# Patient Record
Sex: Male | Born: 1990 | Race: White | Hispanic: No | Marital: Single | Smoking: Former smoker
Health system: Southern US, Community
[De-identification: ages and names within clinical notes are randomized; demographics above are authoritative.]

## PROBLEM LIST (undated history)

## (undated) DIAGNOSIS — F1110 Opioid abuse, uncomplicated: Secondary | ICD-10-CM

## (undated) DIAGNOSIS — Z789 Other specified health status: Secondary | ICD-10-CM

---

## 2013-12-16 ENCOUNTER — Emergency Department (HOSPITAL_COMMUNITY): Payer: No Typology Code available for payment source

## 2013-12-16 ENCOUNTER — Encounter (HOSPITAL_COMMUNITY): Payer: Self-pay | Admitting: Emergency Medicine

## 2013-12-16 ENCOUNTER — Encounter (HOSPITAL_COMMUNITY): Admission: EM | Disposition: A | Discharge status: Home / Self Care | Payer: Self-pay | Source: Home / Self Care

## 2013-12-16 ENCOUNTER — Inpatient Hospital Stay (HOSPITAL_COMMUNITY)
Admission: EM | Admit: 2013-12-16 | Discharge: 2013-12-24 | DRG: 957 | Disposition: A | Discharge status: Home / Self Care | Payer: No Typology Code available for payment source | Attending: General Surgery | Admitting: General Surgery

## 2013-12-16 DIAGNOSIS — K661 Hemoperitoneum: Secondary | ICD-10-CM | POA: Diagnosis present

## 2013-12-16 DIAGNOSIS — D62 Acute posthemorrhagic anemia: Secondary | ICD-10-CM | POA: Diagnosis not present

## 2013-12-16 DIAGNOSIS — R578 Other shock: Secondary | ICD-10-CM

## 2013-12-16 DIAGNOSIS — Z23 Encounter for immunization: Secondary | ICD-10-CM

## 2013-12-16 DIAGNOSIS — Z9081 Acquired absence of spleen: Secondary | ICD-10-CM

## 2013-12-16 DIAGNOSIS — T794XXA Traumatic shock, initial encounter: Secondary | ICD-10-CM | POA: Diagnosis present

## 2013-12-16 DIAGNOSIS — S36039A Unspecified laceration of spleen, initial encounter: Secondary | ICD-10-CM

## 2013-12-16 DIAGNOSIS — S3681XA Injury of peritoneum, initial encounter: Secondary | ICD-10-CM | POA: Diagnosis present

## 2013-12-16 DIAGNOSIS — F17200 Nicotine dependence, unspecified, uncomplicated: Secondary | ICD-10-CM | POA: Diagnosis present

## 2013-12-16 DIAGNOSIS — J9600 Acute respiratory failure, unspecified whether with hypoxia or hypercapnia: Secondary | ICD-10-CM

## 2013-12-16 DIAGNOSIS — D65 Disseminated intravascular coagulation [defibrination syndrome]: Secondary | ICD-10-CM | POA: Diagnosis present

## 2013-12-16 DIAGNOSIS — F1110 Opioid abuse, uncomplicated: Secondary | ICD-10-CM | POA: Diagnosis present

## 2013-12-16 DIAGNOSIS — E876 Hypokalemia: Secondary | ICD-10-CM | POA: Diagnosis not present

## 2013-12-16 DIAGNOSIS — S3609XA Other injury of spleen, initial encounter: Secondary | ICD-10-CM

## 2013-12-16 DIAGNOSIS — Q8901 Asplenia (congenital): Secondary | ICD-10-CM

## 2013-12-16 HISTORY — PX: SPLENECTOMY, TOTAL: SHX788

## 2013-12-16 HISTORY — DX: Opioid abuse, uncomplicated: F11.10

## 2013-12-16 LAB — CBC WITH DIFFERENTIAL/PLATELET
BASOS ABS: 0 10*3/uL (ref 0.0–0.1)
BASOS PCT: 0 % (ref 0–1)
EOS PCT: 0 % (ref 0–5)
Eosinophils Absolute: 0 10*3/uL (ref 0.0–0.7)
HCT: 31.7 % — ABNORMAL LOW (ref 39.0–52.0)
Hemoglobin: 10.6 g/dL — ABNORMAL LOW (ref 13.0–17.0)
Lymphocytes Relative: 10 % — ABNORMAL LOW (ref 12–46)
Lymphs Abs: 2.1 10*3/uL (ref 0.7–4.0)
MCH: 29.2 pg (ref 26.0–34.0)
MCHC: 33.4 g/dL (ref 30.0–36.0)
MCV: 87.3 fL (ref 78.0–100.0)
Monocytes Absolute: 1.8 10*3/uL — ABNORMAL HIGH (ref 0.1–1.0)
Monocytes Relative: 9 % (ref 3–12)
NEUTROS PCT: 80 % — AB (ref 43–77)
Neutro Abs: 15.8 10*3/uL — ABNORMAL HIGH (ref 1.7–7.7)
PLATELETS: 229 10*3/uL (ref 150–400)
RBC: 3.63 MIL/uL — ABNORMAL LOW (ref 4.22–5.81)
RDW: 13.6 % (ref 11.5–15.5)
WBC: 19.8 10*3/uL — ABNORMAL HIGH (ref 4.0–10.5)

## 2013-12-16 LAB — COMPREHENSIVE METABOLIC PANEL
ALBUMIN: 2.7 g/dL — AB (ref 3.5–5.2)
ALK PHOS: 64 U/L (ref 39–117)
ALT: 18 U/L (ref 0–53)
AST: 32 U/L (ref 0–37)
BILIRUBIN TOTAL: 0.4 mg/dL (ref 0.3–1.2)
BUN: 10 mg/dL (ref 6–23)
CHLORIDE: 102 meq/L (ref 96–112)
CO2: 26 meq/L (ref 19–32)
CREATININE: 0.87 mg/dL (ref 0.50–1.35)
Calcium: 7.8 mg/dL — ABNORMAL LOW (ref 8.4–10.5)
GFR calc Af Amer: >90 mL/min (ref >90)
Glucose, Bld: 204 mg/dL — ABNORMAL HIGH (ref 70–99)
POTASSIUM: 4.6 meq/L (ref 3.7–5.3)
Sodium: 141 mEq/L (ref 137–147)
Total Protein: 5.2 g/dL — ABNORMAL LOW (ref 6.0–8.3)

## 2013-12-16 LAB — PREPARE RBC (CROSSMATCH)

## 2013-12-16 LAB — ETHANOL

## 2013-12-16 SURGERY — SPLENECTOMY
Anesthesia: General | Site: Abdomen

## 2013-12-16 MED ORDER — PHENYLEPHRINE 40 MCG/ML (10ML) SYRINGE FOR IV PUSH (FOR BLOOD PRESSURE SUPPORT)
PREFILLED_SYRINGE | INTRAVENOUS | Status: AC
  Filled 2013-12-16: qty 10

## 2013-12-16 MED ORDER — MORPHINE SULFATE 4 MG/ML IJ SOLN
4.0000 mg | Freq: Once | INTRAMUSCULAR | Status: AC
  Administered 2013-12-16: 4 mg via INTRAVENOUS
  Filled 2013-12-16: qty 1

## 2013-12-16 MED ORDER — SODIUM CHLORIDE 0.9 % IV BOLUS (SEPSIS)
1000.0000 mL | Freq: Once | INTRAVENOUS | Status: AC
  Administered 2013-12-16: 1000 mL via INTRAVENOUS

## 2013-12-16 MED ORDER — HYDROMORPHONE HCL PF 1 MG/ML IJ SOLN
1.0000 mg | Freq: Once | INTRAMUSCULAR | Status: AC
  Administered 2013-12-16: 1 mg via INTRAVENOUS
  Filled 2013-12-16: qty 1

## 2013-12-16 MED ORDER — LIDOCAINE HCL (CARDIAC) 20 MG/ML IV SOLN
INTRAVENOUS | Status: AC
  Filled 2013-12-16: qty 5

## 2013-12-16 MED ORDER — FENTANYL CITRATE 0.05 MG/ML IJ SOLN
75.0000 ug | Freq: Once | INTRAMUSCULAR | Status: AC
  Administered 2013-12-16: 75 ug via INTRAVENOUS
  Filled 2013-12-16: qty 2

## 2013-12-16 MED ORDER — FENTANYL CITRATE 0.05 MG/ML IJ SOLN
INTRAMUSCULAR | Status: AC
  Filled 2013-12-16: qty 5

## 2013-12-16 MED ORDER — ARTIFICIAL TEARS OP OINT
TOPICAL_OINTMENT | OPHTHALMIC | Status: AC
  Filled 2013-12-16: qty 3.5

## 2013-12-16 MED ORDER — ONDANSETRON HCL 4 MG/2ML IJ SOLN
INTRAMUSCULAR | Status: AC
  Filled 2013-12-16: qty 2

## 2013-12-16 MED ORDER — CEFAZOLIN SODIUM-DEXTROSE 2-3 GM-% IV SOLR
2.0000 g | Freq: Three times a day (TID) | INTRAVENOUS | Status: DC
  Administered 2013-12-17 – 2013-12-22 (×16): 2 g via INTRAVENOUS
  Filled 2013-12-16 (×19): qty 50

## 2013-12-16 MED ORDER — IOHEXOL 300 MG/ML  SOLN
100.0000 mL | Freq: Once | INTRAMUSCULAR | Status: AC | PRN
  Administered 2013-12-16: 100 mL via INTRAVENOUS

## 2013-12-16 MED ORDER — MIDAZOLAM HCL 2 MG/2ML IJ SOLN
INTRAMUSCULAR | Status: AC
  Filled 2013-12-16: qty 2

## 2013-12-16 MED ORDER — SUCCINYLCHOLINE CHLORIDE 20 MG/ML IJ SOLN
INTRAMUSCULAR | Status: AC
  Filled 2013-12-16: qty 1

## 2013-12-16 MED ORDER — PROPOFOL 10 MG/ML IV BOLUS
INTRAVENOUS | Status: AC
  Filled 2013-12-16: qty 20

## 2013-12-16 MED ORDER — HYDROMORPHONE HCL PF 1 MG/ML IJ SOLN
2.0000 mg | Freq: Once | INTRAMUSCULAR | Status: AC
  Administered 2013-12-16: 2 mg via INTRAVENOUS
  Filled 2013-12-16: qty 2

## 2013-12-16 SURGICAL SUPPLY — 46 items
BLADE SURG ROTATE 9660 (MISCELLANEOUS) ×3 IMPLANT
CANISTER SUCTION 2500CC (MISCELLANEOUS) ×3 IMPLANT
CLIP TI LARGE 6 (CLIP) IMPLANT
COVER SURGICAL LIGHT HANDLE (MISCELLANEOUS) ×3 IMPLANT
DRAPE LAPAROSCOPIC ABDOMINAL (DRAPES) ×3 IMPLANT
DRAPE UTILITY 15X26 W/TAPE STR (DRAPE) ×6 IMPLANT
DRSG COVADERM 4X14 (GAUZE/BANDAGES/DRESSINGS) ×3 IMPLANT
ELECT BLADE 6.5 EXT (BLADE) ×3 IMPLANT
ELECT REM PT RETURN 9FT ADLT (ELECTROSURGICAL) ×3
ELECTRODE REM PT RTRN 9FT ADLT (ELECTROSURGICAL) ×1 IMPLANT
GLOVE BIO SURGEON STRL SZ 6 (GLOVE) ×3 IMPLANT
GLOVE BIOGEL PI IND STRL 6.5 (GLOVE) ×1 IMPLANT
GLOVE BIOGEL PI IND STRL 7.5 (GLOVE) ×1 IMPLANT
GLOVE BIOGEL PI INDICATOR 6.5 (GLOVE) ×2
GLOVE BIOGEL PI INDICATOR 7.5 (GLOVE) ×2
GLOVE SURG SS PI 7.5 STRL IVOR (GLOVE) ×3 IMPLANT
GOWN STRL REUS W/ TWL LRG LVL3 (GOWN DISPOSABLE) ×2 IMPLANT
GOWN STRL REUS W/TWL 2XL LVL3 (GOWN DISPOSABLE) ×3 IMPLANT
GOWN STRL REUS W/TWL LRG LVL3 (GOWN DISPOSABLE) ×4
HEMOSTAT SURGICEL 2X14 (HEMOSTASIS) IMPLANT
KIT BASIN OR (CUSTOM PROCEDURE TRAY) ×3 IMPLANT
KIT ROOM TURNOVER OR (KITS) ×3 IMPLANT
NS IRRIG 1000ML POUR BTL (IV SOLUTION) ×6 IMPLANT
PACK GENERAL/GYN (CUSTOM PROCEDURE TRAY) ×3 IMPLANT
PAD ARMBOARD 7.5X6 YLW CONV (MISCELLANEOUS) ×6 IMPLANT
RELOAD 45 VASCULAR/THIN (ENDOMECHANICALS) IMPLANT
SEPRAFILM PROCEDURAL PACK 3X5 (MISCELLANEOUS) ×3 IMPLANT
SPECIMEN JAR X LARGE (MISCELLANEOUS) ×3 IMPLANT
SPONGE GAUZE 4X4 12PLY (GAUZE/BANDAGES/DRESSINGS) ×6 IMPLANT
SPONGE INTESTINAL PEANUT (DISPOSABLE) ×3 IMPLANT
SPONGE LAP 18X18 X RAY DECT (DISPOSABLE) ×15 IMPLANT
STAPLE ECHEON FLEX 60 POW ENDO (STAPLE) ×3 IMPLANT
STAPLER VISISTAT 35W (STAPLE) ×3 IMPLANT
SUCTION POOLE TIP (SUCTIONS) ×3 IMPLANT
SUT NOVA 1 T20/GS 25DT (SUTURE) IMPLANT
SUT PDS AB 1 TP1 96 (SUTURE) ×6 IMPLANT
SUT PDS II 0 TP-1 LOOPED 60 (SUTURE) ×3 IMPLANT
SUT SILK 2 0 REEL (SUTURE) IMPLANT
SUT SILK 2 0 SH CR/8 (SUTURE) ×3 IMPLANT
SUT SILK 2 0 TIES 10X30 (SUTURE) ×3 IMPLANT
SUT SILK 3 0 TIES 10X30 (SUTURE) ×3 IMPLANT
TIP RIGID 35CM EVICEL (HEMOSTASIS) IMPLANT
TOWEL OR 17X24 6PK STRL BLUE (TOWEL DISPOSABLE) ×3 IMPLANT
TOWEL OR 17X26 10 PK STRL BLUE (TOWEL DISPOSABLE) ×3 IMPLANT
TRAY FOLEY CATH 14FRSI W/METER (CATHETERS) ×3 IMPLANT
WATER STERILE IRR 1000ML POUR (IV SOLUTION) ×3 IMPLANT
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
## 2013-12-16 NOTE — ED Notes (Signed)
Per Dr. Donell BeersByerly - hold second unit of blood at this time

## 2013-12-16 NOTE — ED Provider Notes (Signed)
D/w radiology He has splenic laceration SBP >100 He is awake/alert D/w dr Donell Beersbyerly, will admit and potential operative repair Blood products have been ordered   Joya Gaskinsonald W Wynonia Medero, MD 12/16/13 2211
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
## 2013-12-16 NOTE — ED Notes (Signed)
Pts mother in family room B
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
## 2013-12-16 NOTE — ED Notes (Signed)
Per Dr. Bebe ShaggyWickline, [facility] as bolus if pt hypotensive.

## 2013-12-16 NOTE — ED Provider Notes (Signed)
D/w dr Donell Beersbyerly Updated on vitals/CT findings She will see patient and admit to ICU  Allen Rivera W Jorden Minchey, MD 12/16/13 2310

## 2013-12-16 NOTE — ED Notes (Signed)
Vital signs stable. 
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
## 2013-12-16 NOTE — ED Notes (Signed)
Patient transported to CT 

## 2013-12-17 ENCOUNTER — Inpatient Hospital Stay (HOSPITAL_COMMUNITY): Payer: No Typology Code available for payment source | Admitting: Anesthesiology

## 2013-12-17 ENCOUNTER — Inpatient Hospital Stay (HOSPITAL_COMMUNITY): Payer: No Typology Code available for payment source

## 2013-12-17 ENCOUNTER — Encounter (HOSPITAL_COMMUNITY): Admission: EM | Disposition: A | Discharge status: Home / Self Care | Payer: Self-pay | Source: Home / Self Care

## 2013-12-17 ENCOUNTER — Encounter (HOSPITAL_COMMUNITY): Payer: No Typology Code available for payment source | Admitting: Anesthesiology

## 2013-12-17 DIAGNOSIS — J95821 Acute postprocedural respiratory failure: Secondary | ICD-10-CM

## 2013-12-17 DIAGNOSIS — S36039A Unspecified laceration of spleen, initial encounter: Secondary | ICD-10-CM

## 2013-12-17 DIAGNOSIS — S3681XA Injury of peritoneum, initial encounter: Secondary | ICD-10-CM

## 2013-12-17 DIAGNOSIS — T794XXA Traumatic shock, initial encounter: Secondary | ICD-10-CM

## 2013-12-17 DIAGNOSIS — D689 Coagulation defect, unspecified: Secondary | ICD-10-CM

## 2013-12-17 HISTORY — PX: LAPAROTOMY: SHX154

## 2013-12-17 LAB — CBC
HCT: 32.6 % — ABNORMAL LOW (ref 39.0–52.0)
HCT: 28.3 % — ABNORMAL LOW (ref 39.0–52.0)
HCT: 28 % — ABNORMAL LOW (ref 39.0–52.0)
HEMATOCRIT: 21.6 % — AB (ref 39.0–52.0)
HEMOGLOBIN: 10 g/dL — AB (ref 13.0–17.0)
Hemoglobin: 7.4 g/dL — ABNORMAL LOW (ref 13.0–17.0)
Hemoglobin: 11.5 g/dL — ABNORMAL LOW (ref 13.0–17.0)
Hemoglobin: 10 g/dL — ABNORMAL LOW (ref 13.0–17.0)
MCH: 29.7 pg (ref 26.0–34.0)
MCH: 30.1 pg (ref 26.0–34.0)
MCH: 29.3 pg (ref 26.0–34.0)
MCH: 29.8 pg (ref 26.0–34.0)
MCHC: 34.3 g/dL (ref 30.0–36.0)
MCHC: 35.3 g/dL (ref 30.0–36.0)
MCHC: 35.3 g/dL (ref 30.0–36.0)
MCHC: 35.7 g/dL (ref 30.0–36.0)
MCV: 86.7 fL (ref 78.0–100.0)
MCV: 85.3 fL (ref 78.0–100.0)
MCV: 83 fL (ref 78.0–100.0)
MCV: 83.3 fL (ref 78.0–100.0)
PLATELETS: 123 10*3/uL — AB (ref 150–400)
Platelets: 157 10*3/uL (ref 150–400)
Platelets: 45 10*3/uL — ABNORMAL LOW (ref 150–400)
Platelets: 161 10*3/uL (ref 150–400)
RBC: 2.49 MIL/uL — AB (ref 4.22–5.81)
RBC: 3.82 MIL/uL — ABNORMAL LOW (ref 4.22–5.81)
RBC: 3.41 MIL/uL — AB (ref 4.22–5.81)
RBC: 3.36 MIL/uL — ABNORMAL LOW (ref 4.22–5.81)
RDW: 14.3 % (ref 11.5–15.5)
RDW: 15.1 % (ref 11.5–15.5)
RDW: 15.2 % (ref 11.5–15.5)
RDW: 15.5 % (ref 11.5–15.5)
WBC: 15.5 10*3/uL — AB (ref 4.0–10.5)
WBC: 11.3 10*3/uL — ABNORMAL HIGH (ref 4.0–10.5)
WBC: 10.3 10*3/uL (ref 4.0–10.5)
WBC: 13.1 10*3/uL — AB (ref 4.0–10.5)

## 2013-12-17 LAB — POCT I-STAT 3, ART BLOOD GAS (G3+)
Acid-base deficit: 3 mmol/L — ABNORMAL HIGH (ref 0.0–2.0)
BICARBONATE: 23.2 meq/L (ref 20.0–24.0)
O2 Saturation: 100 %
PH ART: 7.338 — AB (ref 7.350–7.450)
Patient temperature: 97.6
TCO2: 25 mmol/L (ref 0–100)
pCO2 arterial: 43 mmHg (ref 35.0–45.0)
pO2, Arterial: 374 mmHg — ABNORMAL HIGH (ref 80.0–100.0)

## 2013-12-17 LAB — RAPID URINE DRUG SCREEN, HOSP PERFORMED
AMPHETAMINES: NOT DETECTED
BARBITURATES: NOT DETECTED
Benzodiazepines: POSITIVE — AB
Cocaine: NOT DETECTED
Opiates: POSITIVE — AB
Tetrahydrocannabinol: NOT DETECTED

## 2013-12-17 LAB — GLUCOSE, CAPILLARY
GLUCOSE-CAPILLARY: 101 mg/dL — AB (ref 70–99)
Glucose-Capillary: 212 mg/dL — ABNORMAL HIGH (ref 70–99)
Glucose-Capillary: 253 mg/dL — ABNORMAL HIGH (ref 70–99)
Glucose-Capillary: 152 mg/dL — ABNORMAL HIGH (ref 70–99)
Glucose-Capillary: 86 mg/dL (ref 70–99)

## 2013-12-17 LAB — BLOOD PRODUCT ORDER (VERBAL) VERIFICATION

## 2013-12-17 LAB — BLOOD GAS, ARTERIAL
Acid-base deficit: 11.5 mmol/L — ABNORMAL HIGH (ref 0.0–2.0)
Bicarbonate: 13.9 mEq/L — ABNORMAL LOW (ref 20.0–24.0)
Drawn by: 34779
O2 Content: 2 L/min
O2 SAT: 99.3 %
PATIENT TEMPERATURE: 98.6
PH ART: 7.28 — AB (ref 7.350–7.450)
TCO2: 14.8 mmol/L (ref 0–100)
pCO2 arterial: 30.6 mmHg — ABNORMAL LOW (ref 35.0–45.0)
pO2, Arterial: 127 mmHg — ABNORMAL HIGH (ref 80.0–100.0)

## 2013-12-17 LAB — URINALYSIS, ROUTINE W REFLEX MICROSCOPIC
BILIRUBIN URINE: NEGATIVE
GLUCOSE, UA: 100 mg/dL — AB
HGB URINE DIPSTICK: NEGATIVE
KETONES UR: 15 mg/dL — AB
Leukocytes, UA: NEGATIVE
Nitrite: NEGATIVE
PROTEIN: NEGATIVE mg/dL
Specific Gravity, Urine: 1.027 (ref 1.005–1.030)
Urobilinogen, UA: 1 mg/dL (ref 0.0–1.0)
pH: 6 (ref 5.0–8.0)

## 2013-12-17 LAB — BASIC METABOLIC PANEL
BUN: 11 mg/dL (ref 6–23)
BUN: 11 mg/dL (ref 6–23)
CHLORIDE: 108 meq/L (ref 96–112)
CHLORIDE: 108 meq/L (ref 96–112)
CO2: 24 mEq/L (ref 19–32)
CO2: 21 mEq/L (ref 19–32)
CREATININE: 0.92 mg/dL (ref 0.50–1.35)
Calcium: 6.8 mg/dL — ABNORMAL LOW (ref 8.4–10.5)
Calcium: 6.8 mg/dL — ABNORMAL LOW (ref 8.4–10.5)
Creatinine, Ser: 1.04 mg/dL (ref 0.50–1.35)
GFR calc Af Amer: >90 mL/min (ref >90)
GFR calc non Af Amer: >90 mL/min (ref >90)
GFR calc non Af Amer: >90 mL/min (ref >90)
GLUCOSE: 223 mg/dL — AB (ref 70–99)
Glucose, Bld: 297 mg/dL — ABNORMAL HIGH (ref 70–99)
Potassium: 4.8 mEq/L (ref 3.7–5.3)
Potassium: 5.2 mEq/L (ref 3.7–5.3)
SODIUM: 141 meq/L (ref 137–147)
Sodium: 141 mEq/L (ref 137–147)

## 2013-12-17 LAB — PREPARE RBC (CROSSMATCH)

## 2013-12-17 LAB — DIC (DISSEMINATED INTRAVASCULAR COAGULATION) PANEL
APTT: 52 s — AB (ref 24–37)
D-Dimer, Quant: 0.35 ug/mL-FEU (ref 0.00–0.48)
FIBRINOGEN: 107 mg/dL — AB (ref 204–475)
INR: 2.02 — AB (ref 0.00–1.49)
PLATELETS: 28 10*3/uL — AB (ref 150–400)
Smear Review: NONE SEEN

## 2013-12-17 LAB — PROTIME-INR
INR: 1.58 — ABNORMAL HIGH (ref 0.00–1.49)
Prothrombin Time: 18.4 seconds — ABNORMAL HIGH (ref 11.6–15.2)

## 2013-12-17 LAB — TRIGLYCERIDES: Triglycerides: 75 mg/dL (ref <150)

## 2013-12-17 LAB — DIC (DISSEMINATED INTRAVASCULAR COAGULATION)PANEL: Prothrombin Time: 22.2 seconds — ABNORMAL HIGH (ref 11.6–15.2)

## 2013-12-17 LAB — MRSA PCR SCREENING: MRSA BY PCR: POSITIVE — AB

## 2013-12-17 LAB — ABO/RH: ABO/RH(D): O NEG

## 2013-12-17 SURGERY — LAPAROTOMY, EXPLORATORY
Anesthesia: General | Site: Abdomen

## 2013-12-17 MED ORDER — SODIUM CHLORIDE 0.9 % IV SOLN
25.0000 ug/h | INTRAVENOUS | Status: DC
  Administered 2013-12-17: 50 ug/h via INTRAVENOUS
  Administered 2013-12-18: 150 ug/h via INTRAVENOUS
  Filled 2013-12-17 (×2): qty 50

## 2013-12-17 MED ORDER — SUCCINYLCHOLINE CHLORIDE 20 MG/ML IJ SOLN
INTRAMUSCULAR | Status: DC | PRN
  Administered 2013-12-17: 120 mg via INTRAVENOUS

## 2013-12-17 MED ORDER — DIPHENHYDRAMINE HCL 12.5 MG/5ML PO ELIX
12.5000 mg | ORAL_SOLUTION | Freq: Four times a day (QID) | ORAL | Status: DC | PRN
  Filled 2013-12-17: qty 5

## 2013-12-17 MED ORDER — 0.9 % SODIUM CHLORIDE (POUR BTL) OPTIME
TOPICAL | Status: DC | PRN
Start: 2013-12-17 — End: 2013-12-17
  Administered 2013-12-17: 2000 mL
  Administered 2013-12-17: 1000 mL

## 2013-12-17 MED ORDER — NEOSTIGMINE METHYLSULFATE 1 MG/ML IJ SOLN
INTRAMUSCULAR | Status: DC | PRN
  Administered 2013-12-17: 4 mg via INTRAVENOUS

## 2013-12-17 MED ORDER — FENTANYL CITRATE 0.05 MG/ML IJ SOLN
INTRAMUSCULAR | Status: AC
  Filled 2013-12-17: qty 5

## 2013-12-17 MED ORDER — GLYCOPYRROLATE 0.2 MG/ML IJ SOLN
INTRAMUSCULAR | Status: AC
  Filled 2013-12-17: qty 3

## 2013-12-17 MED ORDER — LACTATED RINGERS IV BOLUS (SEPSIS)
1000.0000 mL | Freq: Once | INTRAVENOUS | Status: AC
  Administered 2013-12-17: 1000 mL via INTRAVENOUS

## 2013-12-17 MED ORDER — LACTATED RINGERS IV SOLN
INTRAVENOUS | Status: DC | PRN
  Administered 2013-12-17: via INTRAVENOUS

## 2013-12-17 MED ORDER — SODIUM CHLORIDE 0.9 % IJ SOLN: 9.0000 mL | INTRAMUSCULAR | Status: DC | PRN

## 2013-12-17 MED ORDER — DEXTROSE IN LACTATED RINGERS 5 % IV SOLN
INTRAVENOUS | Status: DC
  Administered 2013-12-17: 100 mL/h via INTRAVENOUS
  Administered 2013-12-17: 18:00:00 via INTRAVENOUS
  Administered 2013-12-18: 100 mL/h via INTRAVENOUS
  Administered 2013-12-18: 10:00:00 via INTRAVENOUS
  Administered 2013-12-19: 100 mL/h via INTRAVENOUS
  Administered 2013-12-20 – 2013-12-22 (×6): via INTRAVENOUS

## 2013-12-17 MED ORDER — DIPHENHYDRAMINE HCL 50 MG/ML IJ SOLN
12.5000 mg | Freq: Four times a day (QID) | INTRAMUSCULAR | Status: DC | PRN
  Filled 2013-12-17: qty 0.25

## 2013-12-17 MED ORDER — MUPIROCIN 2 % EX OINT
1.0000 "application " | TOPICAL_OINTMENT | Freq: Two times a day (BID) | CUTANEOUS | Status: AC
  Administered 2013-12-17 – 2013-12-21 (×10): 1 via NASAL
  Filled 2013-12-17 (×2): qty 22

## 2013-12-17 MED ORDER — ROCURONIUM BROMIDE 100 MG/10ML IV SOLN
INTRAVENOUS | Status: DC | PRN
  Administered 2013-12-17: 30 mg via INTRAVENOUS
  Administered 2013-12-17: 20 mg via INTRAVENOUS

## 2013-12-17 MED ORDER — LACTATED RINGERS IV SOLN
INTRAVENOUS | Status: DC | PRN
  Administered 2013-12-17: 06:00:00 via INTRAVENOUS

## 2013-12-17 MED ORDER — GLYCOPYRROLATE 0.2 MG/ML IJ SOLN
INTRAMUSCULAR | Status: DC | PRN
  Administered 2013-12-17: 0.6 mg via INTRAVENOUS

## 2013-12-17 MED ORDER — CHLORHEXIDINE GLUCONATE 0.12 % MT SOLN
15.0000 mL | Freq: Two times a day (BID) | OROMUCOSAL | Status: DC
  Administered 2013-12-17 – 2013-12-19 (×4): 15 mL via OROMUCOSAL
  Filled 2013-12-17 (×3): qty 15

## 2013-12-17 MED ORDER — HYDROMORPHONE 0.3 MG/ML IV SOLN
INTRAVENOUS | Status: AC
  Filled 2013-12-17: qty 25

## 2013-12-17 MED ORDER — SODIUM CHLORIDE 0.9 % IV SOLN
10.0000 mg | INTRAVENOUS | Status: DC | PRN
  Administered 2013-12-17: 50 ug/min via INTRAVENOUS

## 2013-12-17 MED ORDER — BISACODYL 10 MG RE SUPP: 10.0000 mg | Freq: Every day | RECTAL | Status: DC | PRN

## 2013-12-17 MED ORDER — ONDANSETRON HCL 4 MG PO TABS: 4.0000 mg | ORAL_TABLET | Freq: Four times a day (QID) | ORAL | Status: DC | PRN

## 2013-12-17 MED ORDER — HYDROMORPHONE HCL PF 1 MG/ML IJ SOLN
0.2500 mg | INTRAMUSCULAR | Status: DC | PRN
  Administered 2013-12-17 (×2): 0.5 mg via INTRAVENOUS

## 2013-12-17 MED ORDER — MIDAZOLAM HCL 5 MG/5ML IJ SOLN
INTRAMUSCULAR | Status: DC | PRN
  Administered 2013-12-17: 2 mg via INTRAVENOUS

## 2013-12-17 MED ORDER — ONDANSETRON HCL 4 MG/2ML IJ SOLN: 4.0000 mg | Freq: Four times a day (QID) | INTRAMUSCULAR | Status: DC | PRN

## 2013-12-17 MED ORDER — ONDANSETRON HCL 4 MG/2ML IJ SOLN: 4.0000 mg | Freq: Once | INTRAMUSCULAR | Status: DC | PRN

## 2013-12-17 MED ORDER — LACTATED RINGERS IV SOLN
INTRAVENOUS | Status: DC | PRN
  Administered 2013-12-17 (×2): via INTRAVENOUS

## 2013-12-17 MED ORDER — PROPOFOL 10 MG/ML IV EMUL
INTRAVENOUS | Status: AC
  Filled 2013-12-17: qty 100

## 2013-12-17 MED ORDER — MIDAZOLAM HCL 2 MG/2ML IJ SOLN
INTRAMUSCULAR | Status: AC
  Filled 2013-12-17: qty 4

## 2013-12-17 MED ORDER — ROCURONIUM BROMIDE 100 MG/10ML IV SOLN: INTRAVENOUS | Status: DC | PRN

## 2013-12-17 MED ORDER — STERILE WATER FOR IRRIGATION IR SOLN
Status: DC | PRN
  Administered 2013-12-17: 1000 mL

## 2013-12-17 MED ORDER — NEOSTIGMINE METHYLSULFATE 1 MG/ML IJ SOLN
INTRAMUSCULAR | Status: AC
  Filled 2013-12-17: qty 10

## 2013-12-17 MED ORDER — PROPOFOL 10 MG/ML IV EMUL
0.0000 ug/kg/min | INTRAVENOUS | Status: DC
  Administered 2013-12-17: 10 ug/kg/min via INTRAVENOUS
  Administered 2013-12-17: 50 ug/kg/min via INTRAVENOUS
  Administered 2013-12-17 (×2): 40 ug/kg/min via INTRAVENOUS
  Administered 2013-12-18: 45 ug/kg/min via INTRAVENOUS
  Filled 2013-12-17 (×4): qty 100

## 2013-12-17 MED ORDER — 0.9 % SODIUM CHLORIDE (POUR BTL) OPTIME
TOPICAL | Status: DC | PRN
  Administered 2013-12-17: 3000 mL
  Administered 2013-12-17: 2000 mL

## 2013-12-17 MED ORDER — OXYCODONE HCL 5 MG/5ML PO SOLN: 5.0000 mg | Freq: Once | ORAL | Status: DC | PRN

## 2013-12-17 MED ORDER — PHENYLEPHRINE HCL 10 MG/ML IJ SOLN
INTRAMUSCULAR | Status: DC | PRN
  Administered 2013-12-17 (×2): 80 ug via INTRAVENOUS

## 2013-12-17 MED ORDER — ONDANSETRON HCL 4 MG/2ML IJ SOLN
4.0000 mg | Freq: Four times a day (QID) | INTRAMUSCULAR | Status: DC | PRN
Start: 2013-12-17 — End: 2013-12-17
  Filled 2013-12-17: qty 2

## 2013-12-17 MED ORDER — HYDROMORPHONE HCL PF 1 MG/ML IJ SOLN
INTRAMUSCULAR | Status: AC
  Administered 2013-12-17: 03:00:00
  Filled 2013-12-17: qty 1

## 2013-12-17 MED ORDER — DOCUSATE SODIUM 100 MG PO CAPS
100.0000 mg | ORAL_CAPSULE | Freq: Two times a day (BID) | ORAL | Status: DC
  Administered 2013-12-20 – 2013-12-24 (×9): 100 mg via ORAL
  Filled 2013-12-17 (×10): qty 1

## 2013-12-17 MED ORDER — PANTOPRAZOLE SODIUM 40 MG IV SOLR
40.0000 mg | INTRAVENOUS | Status: DC
  Administered 2013-12-17 – 2013-12-21 (×5): 40 mg via INTRAVENOUS
  Filled 2013-12-17 (×7): qty 40

## 2013-12-17 MED ORDER — HYDROMORPHONE HCL PF 1 MG/ML IJ SOLN: 0.5200 mg | INTRAMUSCULAR | Status: DC | PRN

## 2013-12-17 MED ORDER — MIDAZOLAM HCL 2 MG/2ML IJ SOLN
INTRAMUSCULAR | Status: AC
  Filled 2013-12-17: qty 2

## 2013-12-17 MED ORDER — FENTANYL CITRATE 0.05 MG/ML IJ SOLN
100.0000 ug | INTRAMUSCULAR | Status: DC | PRN
  Administered 2013-12-17: 100 ug via INTRAVENOUS
  Administered 2013-12-17: 50 ug via INTRAVENOUS
  Administered 2013-12-17: 100 ug via INTRAVENOUS
  Administered 2013-12-17: 50 ug via INTRAVENOUS
  Administered 2013-12-17: 100 ug via INTRAVENOUS
  Filled 2013-12-17 (×2): qty 2

## 2013-12-17 MED ORDER — VECURONIUM BROMIDE 10 MG IV SOLR
INTRAVENOUS | Status: DC | PRN
  Administered 2013-12-17: 4 mg via INTRAVENOUS

## 2013-12-17 MED ORDER — OXYCODONE HCL 5 MG PO TABS: 5.0000 mg | ORAL_TABLET | Freq: Once | ORAL | Status: DC | PRN

## 2013-12-17 MED ORDER — NALOXONE HCL 0.4 MG/ML IJ SOLN
0.4000 mg | INTRAMUSCULAR | Status: DC | PRN
  Filled 2013-12-17: qty 1

## 2013-12-17 MED ORDER — FENTANYL CITRATE 0.05 MG/ML IJ SOLN
INTRAMUSCULAR | Status: AC
  Filled 2013-12-17: qty 2

## 2013-12-17 MED ORDER — LIDOCAINE HCL (CARDIAC) 20 MG/ML IV SOLN
INTRAVENOUS | Status: DC | PRN
  Administered 2013-12-17: 60 mg via INTRAVENOUS

## 2013-12-17 MED ORDER — HYDROMORPHONE 0.3 MG/ML IV SOLN
INTRAVENOUS | Status: DC
  Administered 2013-12-17: 02:00:00 via INTRAVENOUS
  Administered 2013-12-17: 1.6 mg via INTRAVENOUS

## 2013-12-17 MED ORDER — INSULIN ASPART 100 UNIT/ML ~~LOC~~ SOLN
0.0000 [IU] | SUBCUTANEOUS | Status: DC
  Administered 2013-12-17: 2 [IU] via SUBCUTANEOUS

## 2013-12-17 MED ORDER — ONDANSETRON HCL 4 MG/2ML IJ SOLN
INTRAMUSCULAR | Status: DC | PRN
  Administered 2013-12-17: 4 mg via INTRAVENOUS

## 2013-12-17 MED ORDER — CHLORHEXIDINE GLUCONATE CLOTH 2 % EX PADS
6.0000 | MEDICATED_PAD | Freq: Every day | CUTANEOUS | Status: AC
  Administered 2013-12-17 – 2013-12-21 (×4): 6 via TOPICAL

## 2013-12-17 MED ORDER — SODIUM CHLORIDE 0.9 % IV SOLN
INTRAVENOUS | Status: DC | PRN
  Administered 2013-12-17: 06:00:00 via INTRAVENOUS

## 2013-12-17 MED ORDER — BIOTENE DRY MOUTH MT LIQD
15.0000 mL | Freq: Four times a day (QID) | OROMUCOSAL | Status: DC
  Administered 2013-12-18 – 2013-12-19 (×8): 15 mL via OROMUCOSAL

## 2013-12-17 MED ORDER — ROCURONIUM BROMIDE 50 MG/5ML IV SOLN
INTRAVENOUS | Status: AC
  Filled 2013-12-17: qty 1

## 2013-12-17 MED ORDER — FENTANYL CITRATE 0.05 MG/ML IJ SOLN
INTRAMUSCULAR | Status: DC | PRN
  Administered 2013-12-17 (×2): 50 ug via INTRAVENOUS

## 2013-12-17 MED ORDER — ARTIFICIAL TEARS OP OINT
TOPICAL_OINTMENT | OPHTHALMIC | Status: DC | PRN
  Administered 2013-12-17: 1 via OPHTHALMIC

## 2013-12-17 MED ORDER — PROPOFOL 10 MG/ML IV BOLUS
INTRAVENOUS | Status: DC | PRN
  Administered 2013-12-17: 200 mg via INTRAVENOUS

## 2013-12-17 MED ORDER — LIDOCAINE HCL (CARDIAC) 20 MG/ML IV SOLN
INTRAVENOUS | Status: DC | PRN
  Administered 2013-12-17: 100 mg via INTRAVENOUS

## 2013-12-17 MED ORDER — CHLORHEXIDINE GLUCONATE 0.12 % MT SOLN: 15.0000 mL | Freq: Two times a day (BID) | OROMUCOSAL | Status: DC

## 2013-12-17 MED ORDER — FENTANYL CITRATE 0.05 MG/ML IJ SOLN
INTRAMUSCULAR | Status: DC | PRN
  Administered 2013-12-17 (×7): 50 ug via INTRAVENOUS
  Administered 2013-12-17: 150 ug via INTRAVENOUS

## 2013-12-17 MED ORDER — HEPARIN SODIUM (PORCINE) 1000 UNIT/ML IJ SOLN: 3000.0000 [IU] | INTRAMUSCULAR | Status: DC | PRN

## 2013-12-17 SURGICAL SUPPLY — 63 items
BLADE SURG ROTATE 9660 (MISCELLANEOUS) IMPLANT
CANISTER SUCTION 2500CC (MISCELLANEOUS) ×3 IMPLANT
CHLORAPREP W/TINT 26ML (MISCELLANEOUS) ×3 IMPLANT
COVER MAYO STAND STRL (DRAPES) ×3 IMPLANT
COVER SURGICAL LIGHT HANDLE (MISCELLANEOUS) ×3 IMPLANT
DRAPE LAPAROSCOPIC ABDOMINAL (DRAPES) ×3 IMPLANT
DRAPE PROXIMA HALF (DRAPES) IMPLANT
DRAPE UTILITY 15X26 W/TAPE STR (DRAPE) ×6 IMPLANT
DRAPE WARM FLUID 44X44 (DRAPE) ×6 IMPLANT
DRSG COVADERM 4X10 (GAUZE/BANDAGES/DRESSINGS) ×3 IMPLANT
DRSG COVADERM 4X14 (GAUZE/BANDAGES/DRESSINGS) ×3 IMPLANT
DRSG OPSITE POSTOP 4X10 (GAUZE/BANDAGES/DRESSINGS) IMPLANT
DRSG OPSITE POSTOP 4X8 (GAUZE/BANDAGES/DRESSINGS) IMPLANT
ELECT BLADE 6.5 EXT (BLADE) IMPLANT
ELECT CAUTERY BLADE 6.4 (BLADE) ×3 IMPLANT
ELECT REM PT RETURN 9FT ADLT (ELECTROSURGICAL) ×3
ELECTRODE REM PT RTRN 9FT ADLT (ELECTROSURGICAL) ×1 IMPLANT
EVACUATOR SILICONE 100CC (DRAIN) ×3 IMPLANT
GLOVE BIO SURGEON STRL SZ 6 (GLOVE) ×6 IMPLANT
GLOVE BIO SURGEON STRL SZ8 (GLOVE) ×3 IMPLANT
GLOVE BIOGEL PI IND STRL 6.5 (GLOVE) ×2 IMPLANT
GLOVE BIOGEL PI IND STRL 7.5 (GLOVE) ×1 IMPLANT
GLOVE BIOGEL PI IND STRL 8 (GLOVE) ×1 IMPLANT
GLOVE BIOGEL PI INDICATOR 6.5 (GLOVE) ×4
GLOVE BIOGEL PI INDICATOR 7.5 (GLOVE) ×2
GLOVE BIOGEL PI INDICATOR 8 (GLOVE) ×2
GLOVE SURG SS PI 7.5 STRL IVOR (GLOVE) ×6 IMPLANT
GOWN STRL REUS W/ TWL LRG LVL3 (GOWN DISPOSABLE) ×2 IMPLANT
GOWN STRL REUS W/TWL 2XL LVL3 (GOWN DISPOSABLE) ×3 IMPLANT
GOWN STRL REUS W/TWL LRG LVL3 (GOWN DISPOSABLE) ×4
GOWN STRL REUS W/TWL XL LVL3 (GOWN DISPOSABLE) ×3 IMPLANT
HEMOSTAT SNOW SURGICEL 2X4 (HEMOSTASIS) ×6 IMPLANT
KIT BASIN OR (CUSTOM PROCEDURE TRAY) ×3 IMPLANT
KIT ROOM TURNOVER OR (KITS) ×3 IMPLANT
LIGASURE IMPACT 36 18CM CVD LR (INSTRUMENTS) IMPLANT
NS IRRIG 1000ML POUR BTL (IV SOLUTION) ×6 IMPLANT
PACK GENERAL/GYN (CUSTOM PROCEDURE TRAY) ×3 IMPLANT
PAD ARMBOARD 7.5X6 YLW CONV (MISCELLANEOUS) ×3 IMPLANT
PENCIL BUTTON HOLSTER BLD 10FT (ELECTRODE) IMPLANT
SPECIMEN JAR LARGE (MISCELLANEOUS) IMPLANT
SPONGE GAUZE 4X4 12PLY (GAUZE/BANDAGES/DRESSINGS) ×3 IMPLANT
SPONGE LAP 18X18 X RAY DECT (DISPOSABLE) ×24 IMPLANT
STAPLER VISISTAT 35W (STAPLE) ×3 IMPLANT
SUCTION POOLE TIP (SUCTIONS) ×3 IMPLANT
SUT ETHILON 3 0 FSL (SUTURE) ×3 IMPLANT
SUT PDS AB 1 TP1 96 (SUTURE) ×6 IMPLANT
SUT PDS II 0 TP-1 LOOPED 60 (SUTURE) ×6 IMPLANT
SUT SILK 2 0 SH (SUTURE) ×12 IMPLANT
SUT SILK 2 0 SH CR/8 (SUTURE) ×3 IMPLANT
SUT SILK 2 0 TIES 10X30 (SUTURE) ×3 IMPLANT
SUT SILK 3 0 SH CR/8 (SUTURE) ×3 IMPLANT
SUT SILK 3 0 TIES 10X30 (SUTURE) ×3 IMPLANT
SUT VIC AB 2-0 SH 18 (SUTURE) ×3 IMPLANT
SUT VIC AB 3-0 SH 18 (SUTURE) ×3 IMPLANT
SUT VICRYL 4-0 PS2 18IN ABS (SUTURE) IMPLANT
SUT VICRYL AB 2 0 TIES (SUTURE) ×3 IMPLANT
SUT VICRYL AB 3 0 TIES (SUTURE) ×3 IMPLANT
TOWEL OR 17X24 6PK STRL BLUE (TOWEL DISPOSABLE) ×3 IMPLANT
TOWEL OR 17X26 10 PK STRL BLUE (TOWEL DISPOSABLE) ×3 IMPLANT
TRAY FOLEY CATH 16FRSI W/METER (SET/KITS/TRAYS/PACK) IMPLANT
TUBE CONNECTING 12'X1/4 (SUCTIONS)
TUBE CONNECTING 12X1/4 (SUCTIONS) IMPLANT
YANKAUER SUCT BULB TIP NO VENT (SUCTIONS) ×3 IMPLANT

## 2013-12-17 NOTE — Progress Notes (Signed)
Patient ID: Allen MaffucciSy Rivera, male   DOB: 06/24/1991, 23 y.o.   MRN: [id number] Improving. Transfuse 1u FFP and 1u PLTs,  [name], MD, MPH, FACS Trauma: [phone] General Surgery: [phone]

## 2013-12-17 NOTE — Transfer of Care (Signed)
Immediate Anesthesia Transfer of Care Note  Patient: Allen Rivera  Procedure(s) Performed: Procedure(s): SPLENECTOMY (N/A)  Patient Location: PACU  Anesthesia Type:General  Level of Consciousness: awake and alert   Airway & Oxygen Therapy: Patient Spontanous Breathing  Post-op Assessment: Report given to PACU RN, Post -op Vital signs reviewed and stable and Patient moving all extremities  Post vital signs: Reviewed and stable  Complications: No apparent anesthesia complications

## 2013-12-17 NOTE — Anesthesia Postprocedure Evaluation (Signed)
  Anesthesia Post-op Note  Patient: Allen Rivera  Procedure(s) Performed: Procedure(s): SPLENECTOMY (N/A)  Patient Location: PACU  Anesthesia Type:General  Level of Consciousness: awake  Airway and Oxygen Therapy: Patient Spontanous Breathing  Post-op Pain: mild  Post-op Assessment: Post-op Vital signs reviewed  Post-op Vital Signs: Reviewed  Complications: No apparent anesthesia complications

## 2013-12-17 NOTE — Preoperative (Signed)
Beta Blockers   Reason not to administer Beta Blockers:Not Applicable 
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
## 2013-12-17 NOTE — Transfer of Care (Signed)
Immediate Anesthesia Transfer of Care Note  Patient: Allen Rivera  Procedure(s) Performed: Procedure(s): EXPLORATORY LAPAROTOMY suture ligation of bleeding vessel (N/A)  Patient Location: PACU  Anesthesia Type:General  Level of Consciousness: Patient remains intubated per anesthesia plan  Airway & Oxygen Therapy: Patient remains intubated per anesthesia plan and Patient placed on Ventilator (see vital sign flow sheet for setting)  Post-op Assessment: Report given to PACU RN and Post -op Vital signs reviewed and stable  Post vital signs: Reviewed and stable  Complications: No apparent anesthesia complications

## 2013-12-17 NOTE — Anesthesia Procedure Notes (Signed)
Procedure Name: Intubation Date/Time: 12/17/2013 12:25 AM Performed by: Luster LandsbergHASE, Marietta Sikkema R Pre-anesthesia Checklist: Patient identified, Emergency Drugs available, Suction available, Patient being monitored and Timeout performed Patient Re-evaluated:Patient Re-evaluated prior to inductionOxygen Delivery Method: Circle system utilized Preoxygenation: Pre-oxygenation with 100% oxygen Intubation Type: IV induction, Rapid sequence and Cricoid Pressure applied Laryngoscope Size: Mac and 3 Grade View: Grade I Tube type: Oral Tube size: 7.5 mm Number of attempts: 1 Airway Equipment and Method: Stylet Placement Confirmation: ETT inserted through vocal cords under direct vision,  positive ETCO2 and breath sounds checked- equal and bilateral Secured at: 22 cm Tube secured with: Tape Dental Injury: Teeth and Oropharynx as per pre-operative assessment  Comments: Head/neck neutral throughout DVL/intubation

## 2013-12-17 NOTE — Anesthesia Procedure Notes (Addendum)
Procedure Name: Intubation Date/Time: 12/17/2013 5:55 AM Performed by: Molli HazardGORDON, Lamira Borin M Pre-anesthesia Checklist: Patient identified, Emergency Drugs available, Suction available and Patient being monitored Patient Re-evaluated:Patient Re-evaluated prior to inductionOxygen Delivery Method: Circle system utilized Preoxygenation: Pre-oxygenation with 100% oxygen Intubation Type: IV induction, Rapid sequence and Cricoid Pressure applied Laryngoscope Size: Miller and 2 Grade View: Grade I Tube type: Oral Tube size: 7.5 mm Number of attempts: 1 Airway Equipment and Method: Stylet Placement Confirmation: ETT inserted through vocal cords under direct vision,  positive ETCO2 and breath sounds checked- equal and bilateral Secured at: 23 cm Tube secured with: Tape Dental Injury: Teeth and Oropharynx as per pre-operative assessment    Date/Time: 12/17/2013 7:13 AM Performed by: Toney SangGORDON, Mohamed Portlock M LMA: LMA with gastric port inserted Comments: ETT changed to subglottic ETT over bougie stylette. =BBS +ETCO2. Securely taped at 23cm

## 2013-12-17 NOTE — Progress Notes (Signed)
Patient ID: Allen Rivera, male   DOB: 02/11/1991, 23 y.o.   MRN: [id number] [facility]Ns assisting. I also updated his mother. Violeta GelinasBurke Hendrix Console, MD, MPH, FACS Trauma: [phone] General Surgery: [phone]
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
## 2013-12-17 NOTE — Progress Notes (Signed)
Orders clarified with Dr. Janee Mornhompson regarding which blood products to give right now. Per MD-give 1 unit platelets (done), 1 unit FFP, and 1 unit PRBC. 3 units PRBC's and 1 unit FFP sent back to blood bank in cooler.   Kerin RansomBlaire Rossi Silvestro, RN

## 2013-12-17 NOTE — Anesthesia Preprocedure Evaluation (Addendum)
Anesthesia Evaluation  Patient identified by MRN, date of birth, ID band Patient awake    Reviewed: Allergy & Precautions, H&P , NPO status , Patient's Chart, lab work & pertinent test results  Airway Mallampati: II TM Distance: >3 FB Neck ROM: Limited    Dental  (+) Dental Advisory Given,    Pulmonary Current Smoker,  breath sounds clear to auscultation        Cardiovascular Rhythm:Regular Rate:Tachycardia     Neuro/Psych    GI/Hepatic   Endo/Other    Renal/GU      Musculoskeletal   Abdominal   Peds  Hematology   Anesthesia Other Findings   Reproductive/Obstetrics                        Anesthesia Physical Anesthesia Plan  ASA: II and emergent  Anesthesia Plan: General   Post-op Pain Management:    Induction: Intravenous, Rapid sequence and Cricoid pressure planned  Airway Management Planned: Oral ETT  Additional Equipment:   Intra-op Plan:   Post-operative Plan: Extubation in OR  Informed Consent: I have reviewed the patients History and Physical, chart, labs and discussed the procedure including the risks, benefits and alternatives for the proposed anesthesia with the patient or authorized representative who has indicated his/her understanding and acceptance.   Dental advisory given  Plan Discussed with: CRNA, Anesthesiologist and Surgeon  Anesthesia Plan Comments:         Anesthesia Quick Evaluation

## 2013-12-17 NOTE — Anesthesia Postprocedure Evaluation (Signed)
  Anesthesia Post-op Note  Patient: Allen Rivera  Procedure(s) Performed: Procedure(s): EXPLORATORY LAPAROTOMY suture ligation of bleeding vessel (N/A)  Patient Location: ICU  Anesthesia Type:General  Level of Consciousness: sedated and Patient remains intubated per anesthesia plan  Airway and Oxygen Therapy: Patient remains intubated per anesthesia plan and Patient placed on Ventilator (see vital sign flow sheet for setting)  Post-op Pain: none  Post-op Assessment: Post-op Vital signs reviewed  Post-op Vital Signs: Reviewed  Complications: No apparent anesthesia complications

## 2013-12-17 NOTE — Progress Notes (Signed)
Dilauded PCA syinge wasted with Marthann Schillerobert Mueller RN

## 2013-12-17 NOTE — Progress Notes (Signed)
CRITICAL VALUE ALERT  Critical value received:  platelet  Date of notification:  12/17/13  Time of notification:  0815  Critical value read back:yes  Nurse who received alert:  Kerin RansomBlaire Jayanth Szczesniak, RN  MD notified (1st page):  Dr. Janee Mornhompson  [facility] of first page:  [facility]  MD notified (2nd page):  Time of second page:  Responding MD:  Dr. Janee Mornhompson  Time MD responded:  [phone]
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
## 2013-12-17 NOTE — Anesthesia Postprocedure Evaluation (Signed)
  Anesthesia Post-op Note  Patient: Allen Rivera  Procedure(s) Performed: Procedure(s): EXPLORATORY LAPAROTOMY (N/A)  Patient Location: PACU  Anesthesia Type:General  Level of Consciousness: awake, alert  and oriented  Airway and Oxygen Therapy: Patient Spontanous Breathing and Patient connected to nasal cannula oxygen  Post-op Pain: mild  Post-op Assessment: Post-op Vital signs reviewed  Post-op Vital Signs: Reviewed  Complications: No apparent anesthesia complications

## 2013-12-17 NOTE — Anesthesia Preprocedure Evaluation (Signed)
Anesthesia Evaluation  Patient identified by MRN, date of birth, ID band Patient awake    Reviewed: Allergy & Precautions, H&P , NPO status , Patient's Chart, lab work & pertinent test results  Airway Mallampati: I TM Distance: >3 FB     Dental  (+) Teeth Intact, Dental Advisory Given   Pulmonary  breath sounds clear to auscultation        Cardiovascular Rhythm:Regular Rate:Normal     Neuro/Psych    GI/Hepatic   Endo/Other    Renal/GU      Musculoskeletal   Abdominal   Peds  Hematology   Anesthesia Other Findings Decreased BP returning to OR.   Reproductive/Obstetrics                           Anesthesia Physical Anesthesia Plan  ASA: II and emergent  Anesthesia Plan: General   Post-op Pain Management:    Induction: Intravenous  Airway Management Planned: Oral ETT  Additional Equipment:   Intra-op Plan:   Post-operative Plan: Extubation in OR  Informed Consent: I have reviewed the patients History and Physical, chart, labs and discussed the procedure including the risks, benefits and alternatives for the proposed anesthesia with the patient or authorized representative who has indicated his/her understanding and acceptance.   Dental advisory given  Plan Discussed with: CRNA, Anesthesiologist and Surgeon  Anesthesia Plan Comments:         Anesthesia Quick Evaluation

## 2013-12-17 NOTE — Procedures (Signed)
Central Venous Catheter Insertion Procedure Note Allen MaffucciSy Rivera [id number] 06/12/1991  Procedure: Insertion of Central Venous Catheter Indications: Assessment of intravascular volume  Procedure Details Consent: Emergency Time Out: Verified patient identification, verified procedure, site/side was marked, verified correct patient position, special equipment/implants available, medications/allergies/relevent history reviewed, required imaging and test results available.  Performed  Maximum sterile technique was used including antiseptics, cap, gloves, gown, hand hygiene, mask and sheet. Skin prep: Chlorhexidine; local anesthetic administered A antimicrobial bonded/coated triple lumen catheter was placed in the left subclavian vein using the Seldinger technique.  Evaluation Blood flow good Complications: No apparent complications Patient did tolerate procedure well. Chest X-ray ordered to verify placement.  CXR: pending.  Allen Rivera E 12/17/2013, 8:24 AM

## 2013-12-17 NOTE — Procedures (Signed)
Arterial Catheter Insertion Procedure Note Allen MaffucciSy Rivera [id number] 08/11/1991  Procedure: Insertion of Arterial Catheter  Indications: Blood pressure monitoring  Procedure Details Consent: Unable to obtain consent because of emergent medical necessity. Time Out: Verified patient identification, verified procedure, site/side was marked, verified correct patient position, special equipment/implants available, medications/allergies/relevent history reviewed, required imaging and test results available.  Performed  Maximum sterile technique was used including antiseptics, cap, gloves, gown, hand hygiene, mask and sheet. Skin prep: Chlorhexidine; local anesthetic administered 20 gauge catheter was inserted into right radial artery using the Seldinger technique.  Evaluation Blood flow good; BP tracing good. Complications: No apparent complications.   Malachi ParadiseSpencer, Allen Rivera 12/17/2013

## 2013-12-17 NOTE — Progress Notes (Signed)
UR completed.  Ryna Beckstrom, RN BSN [facility] CCM Trauma/Neuro ICU Case Manager [phone]  

## 2013-12-18 LAB — CBC
HCT: 27.2 % — ABNORMAL LOW (ref 39.0–52.0)
HEMATOCRIT: 26.6 % — AB (ref 39.0–52.0)
HEMOGLOBIN: 9.7 g/dL — AB (ref 13.0–17.0)
HEMOGLOBIN: 9.4 g/dL — AB (ref 13.0–17.0)
MCH: 30.4 pg (ref 26.0–34.0)
MCH: 29.2 pg (ref 26.0–34.0)
MCHC: 36.5 g/dL — ABNORMAL HIGH (ref 30.0–36.0)
MCHC: 34.6 g/dL (ref 30.0–36.0)
MCV: 83.4 fL (ref 78.0–100.0)
MCV: 84.5 fL (ref 78.0–100.0)
Platelets: 156 10*3/uL (ref 150–400)
Platelets: 188 10*3/uL (ref 150–400)
RBC: 3.19 MIL/uL — ABNORMAL LOW (ref 4.22–5.81)
RBC: 3.22 MIL/uL — AB (ref 4.22–5.81)
RDW: 15.6 % — ABNORMAL HIGH (ref 11.5–15.5)
RDW: 15.6 % — ABNORMAL HIGH (ref 11.5–15.5)
WBC: 15.4 10*3/uL — ABNORMAL HIGH (ref 4.0–10.5)
WBC: 19.7 10*3/uL — ABNORMAL HIGH (ref 4.0–10.5)

## 2013-12-18 LAB — PREPARE FRESH FROZEN PLASMA
UNIT DIVISION: 0
UNIT DIVISION: 0
UNIT DIVISION: 0
Unit division: 0
Unit division: 0
Unit division: 0
Unit division: 0
Unit division: 0
Unit division: 0

## 2013-12-18 LAB — GLUCOSE, CAPILLARY
GLUCOSE-CAPILLARY: 100 mg/dL — AB (ref 70–99)
GLUCOSE-CAPILLARY: 91 mg/dL (ref 70–99)
Glucose-Capillary: 104 mg/dL — ABNORMAL HIGH (ref 70–99)

## 2013-12-18 LAB — BASIC METABOLIC PANEL
BUN: 10 mg/dL (ref 6–23)
CALCIUM: 7.2 mg/dL — AB (ref 8.4–10.5)
CO2: 28 meq/L (ref 19–32)
Chloride: 106 mEq/L (ref 96–112)
Creatinine, Ser: 1.04 mg/dL (ref 0.50–1.35)
GFR calc Af Amer: >90 mL/min (ref >90)
GFR calc non Af Amer: >90 mL/min (ref >90)
GLUCOSE: 107 mg/dL — AB (ref 70–99)
Potassium: 3.2 mEq/L — ABNORMAL LOW (ref 3.7–5.3)
Sodium: 140 mEq/L (ref 137–147)

## 2013-12-18 LAB — PREPARE PLATELET PHERESIS
UNIT DIVISION: 0
Unit division: 0

## 2013-12-18 LAB — POCT I-STAT 3, ART BLOOD GAS (G3+)
Acid-Base Excess: 3 mmol/L — ABNORMAL HIGH (ref 0.0–2.0)
Bicarbonate: 27.2 mEq/L — ABNORMAL HIGH (ref 20.0–24.0)
O2 Saturation: 98 %
PCO2 ART: 42.9 mmHg (ref 35.0–45.0)
Patient temperature: 101.1
TCO2: 28 mmol/L (ref 0–100)
pH, Arterial: 7.416 (ref 7.350–7.450)
pO2, Arterial: 103 mmHg — ABNORMAL HIGH (ref 80.0–100.0)

## 2013-12-18 MED ORDER — FENTANYL 10 MCG/ML IV SOLN
INTRAVENOUS | Status: DC
  Administered 2013-12-18: 300 ug via INTRAVENOUS
  Administered 2013-12-18: 390 ug via INTRAVENOUS
  Administered 2013-12-18: 11:00:00 via INTRAVENOUS
  Filled 2013-12-18 (×2): qty 50

## 2013-12-18 MED ORDER — DIPHENHYDRAMINE HCL 50 MG/ML IJ SOLN: 12.5000 mg | Freq: Four times a day (QID) | INTRAMUSCULAR | Status: DC | PRN

## 2013-12-18 MED ORDER — NALOXONE HCL 0.4 MG/ML IJ SOLN: 0.4000 mg | INTRAMUSCULAR | Status: DC | PRN

## 2013-12-18 MED ORDER — IPRATROPIUM-ALBUTEROL 0.5-2.5 (3) MG/3ML IN SOLN: 3.0000 mL | RESPIRATORY_TRACT | Status: DC | PRN

## 2013-12-18 MED ORDER — DIPHENHYDRAMINE HCL 12.5 MG/5ML PO ELIX
12.5000 mg | ORAL_SOLUTION | Freq: Four times a day (QID) | ORAL | Status: DC | PRN
  Filled 2013-12-18: qty 5

## 2013-12-18 MED ORDER — POTASSIUM CHLORIDE 10 MEQ/50ML IV SOLN
10.0000 meq | INTRAVENOUS | Status: AC
  Administered 2013-12-18 (×3): 10 meq via INTRAVENOUS
  Filled 2013-12-18 (×3): qty 50

## 2013-12-18 MED ORDER — SODIUM CHLORIDE 0.9 % IJ SOLN: 9.0000 mL | INTRAMUSCULAR | Status: DC | PRN

## 2013-12-18 MED ORDER — HYDROMORPHONE 0.3 MG/ML IV SOLN
INTRAVENOUS | Status: DC
  Administered 2013-12-18: 17:00:00 via INTRAVENOUS
  Administered 2013-12-18: 4.8 mg via INTRAVENOUS
  Administered 2013-12-18: 22:00:00 via INTRAVENOUS
  Administered 2013-12-19: 18 mL via INTRAVENOUS
  Administered 2013-12-19: 05:00:00 via INTRAVENOUS
  Administered 2013-12-19: 4.19 mg via INTRAVENOUS
  Administered 2013-12-19: 15 mL via INTRAVENOUS
  Administered 2013-12-19: 4.2 mg via INTRAVENOUS
  Administered 2013-12-19: 4.5 mg via INTRAVENOUS
  Administered 2013-12-19: 3.9 mg via INTRAVENOUS
  Administered 2013-12-19: 6 mL via INTRAVENOUS
  Administered 2013-12-20: 3.3 mg via INTRAVENOUS
  Administered 2013-12-20: 9 mL via INTRAVENOUS
  Administered 2013-12-20 (×3): via INTRAVENOUS
  Administered 2013-12-20: 5.1 mg via INTRAVENOUS
  Administered 2013-12-20: 12 mL via INTRAVENOUS
  Administered 2013-12-20: via INTRAVENOUS
  Administered 2013-12-20: 9.85 mg via INTRAVENOUS
  Administered 2013-12-21: 11:00:00 via INTRAVENOUS
  Administered 2013-12-21: 4.5 mg via INTRAVENOUS
  Administered 2013-12-21: 1.08 mg via INTRAVENOUS
  Administered 2013-12-21: 5.4 mg via INTRAVENOUS
  Administered 2013-12-21: 3.9 mg via INTRAVENOUS
  Administered 2013-12-21: 3 mg via INTRAVENOUS
  Filled 2013-12-18 (×11): qty 25

## 2013-12-18 MED ORDER — ACETAMINOPHEN 10 MG/ML IV SOLN
1000.0000 mg | Freq: Four times a day (QID) | INTRAVENOUS | Status: AC
  Administered 2013-12-18 – 2013-12-19 (×4): 1000 mg via INTRAVENOUS
  Filled 2013-12-18 (×4): qty 100

## 2013-12-18 MED ORDER — ONDANSETRON HCL 4 MG/2ML IJ SOLN: 4.0000 mg | Freq: Four times a day (QID) | INTRAMUSCULAR | Status: DC | PRN

## 2013-12-18 MED ORDER — ONDANSETRON HCL 4 MG/2ML IJ SOLN
4.0000 mg | Freq: Four times a day (QID) | INTRAMUSCULAR | Status: DC | PRN
Start: 2013-12-18 — End: 2013-12-21

## 2013-12-18 MED ORDER — METHOCARBAMOL 100 MG/ML IJ SOLN
1000.0000 mg | Freq: Four times a day (QID) | INTRAVENOUS | Status: DC | PRN
  Administered 2013-12-18 – 2013-12-22 (×8): 1000 mg via INTRAVENOUS
  Filled 2013-12-18 (×12): qty 10

## 2013-12-18 MED ORDER — IPRATROPIUM-ALBUTEROL 0.5-2.5 (3) MG/3ML IN SOLN: 3.0000 mL | Freq: Four times a day (QID) | RESPIRATORY_TRACT | Status: DC

## 2013-12-18 NOTE — Progress Notes (Addendum)
Patient ID: Allen Rivera, male   DOB: [birth year], 23 y.o.   MRN: [id number] Follow up - Trauma Critical Care  Patient Details:    Allen Rivera is an 23 y.o. male.  Lines/tubes : Airway 7.5 mm (Active)  Secured at (cm) 23 cm 12/18/2013  3:52 AM  Measured From Lips 12/18/2013  3:52 AM  Secured Location Right 12/18/2013  3:52 AM  Secured By Pink Tape 12/18/2013  3:52 AM  Site Condition Dry 12/18/2013  3:52 AM     CVC Triple Lumen 12/17/13 Left Subclavian (Active)  Indication for Insertion or Continuance of Line Prolonged intravenous therapies 12/17/2013 10:00 PM  Site Assessment Clean;Dry;Intact 12/17/2013 10:00 PM  Proximal Lumen Status Infusing 12/17/2013 10:00 PM  Medial Infusing 12/17/2013 10:00 PM  Distal Lumen Status Capped (Central line);Other (Comment) 12/17/2013 10:00 PM  Dressing Type Transparent;Occlusive 12/17/2013 10:00 PM  Dressing Status Clean;Dry;Intact;Antimicrobial disc in place 12/17/2013 10:00 PM  Line Care Connections checked and tightened;Leveled;Zeroed and calibrated 12/17/2013 10:00 PM  Dressing Intervention Dressing changed;Antimicrobial disc changed 12/17/2013  3:00 PM  Dressing Change Due 12/24/13 12/17/2013 10:00 PM     Arterial Line 12/17/13 Right Radial (Active)  Site Assessment Clean;Dry;Intact 12/17/2013 10:00 PM  Line Status Pulsatile blood flow 12/17/2013 10:00 PM  Art Line Waveform Appropriate;Square wave test performed;Whip 12/17/2013 10:00 PM  Art Line Interventions Zeroed and calibrated;Leveled;Connections checked and tightened;Flushed per protocol 12/17/2013 10:00 PM  Color/Movement/Sensation Capillary refill less than 3 sec 12/17/2013 10:00 PM  Dressing Type Transparent;Occlusive 12/17/2013 10:00 PM  Dressing Status Clean;Dry;Intact 12/17/2013 10:00 PM  Dressing Change Due 12/24/13 12/17/2013 10:00 PM     Closed System Drain 1 Left LLQ Bulb (JP) 19 Fr. (Active)  Site Description Unable to view 12/17/2013 10:00 PM  Dressing Status Clean;Dry;Intact 12/17/2013 10:00 PM   Drainage Appearance Bloody 12/17/2013 10:00 PM  Status To suction (Charged) 12/17/2013 10:00 PM  Output (mL) 125 mL 12/18/2013  6:00 AM     NG/OG Tube Nasogastric 18 Fr. Right nare (Active)  Placement Verification Auscultation 12/17/2013 10:00 PM  Site Assessment Clean;Dry;Intact 12/17/2013 10:00 PM  Status Irrigated;Suction-low intermittent 12/17/2013 10:00 PM  Drainage Appearance Pink tinged 12/17/2013 10:00 PM  Intake (mL) 30 mL 12/18/2013  4:00 AM  Output (mL) 140 mL 12/17/2013 11:00 PM     Urethral Catheter M. Turner Latex;Straight-tip (Active)  Indication for Insertion or Continuance of Catheter Unstable critical patients (first 24-48 hours) 12/17/2013 10:00 PM  Site Assessment Clean;Intact 12/17/2013 10:00 PM  Catheter Maintenance Bag below level of bladder;Catheter secured;Drainage bag/tubing not touching floor;Insertion date on drainage bag;No dependent loops;Seal intact 12/17/2013 10:00 PM  Collection Container Standard drainage bag 12/17/2013 10:00 PM  Securement Method Leg strap 12/17/2013 10:00 PM  Urinary Catheter Interventions Unclamped 12/17/2013  8:00 AM  Output (mL) 65 mL 12/18/2013  6:00 AM    Microbiology/Sepsis markers: Results for orders placed during the hospital encounter of 12/16/13  MRSA PCR SCREENING     Status: Abnormal   Collection Time    12/17/13  2:42 AM      Result Value Ref Range Status   MRSA by PCR POSITIVE (*) NEGATIVE Final   Comment:            The GeneXpert MRSA Assay (FDA     approved for NASAL specimens     only), is one component of a     comprehensive MRSA colonization     surveillance program. It is not     intended to diagnose MRSA  infection nor to guide or     monitor treatment for     MRSA infections.     RESULT CALLED TO, READ BACK BY AND VERIFIED WITH:     PARRISH,J RN [id number] AT 0425 ON [id number] SKEENP    Anti-infectives:  Anti-infectives   Start     Dose/Rate Route Frequency Ordered Stop   12/17/13 0000  ceFAZolin (ANCEF) IVPB 2  g/50 mL premix     2 g 100 mL/hr over 30 Minutes Intravenous 3 times per day 12/16/13 2352        Best Practice/Protocols:  VTE Prophylaxis: Mechanical Continous Sedation  Consults:      Studies:    Events:  Subjective:    Overnight Issues:   Objective:  Vital signs for last 24 hours: Temp:  [97.5 F (36.4 C)-101.1 F (38.4 C)] 101.1 F (38.4 C) (03/14 0400) Pulse Rate:  [86-127] 127 (03/14 0600) Resp:  [14-28] 21 (03/14 0600) BP: (114-163)/(58-106) 120/59 mmHg (03/14 0600) SpO2:  [98 %-100 %] 100 % (03/14 0600) Arterial Line BP: (117-171)/(59-111) 162/81 mmHg (03/14 0600) FiO2 (%):  [40 %-100 %] 40 % (03/14 0352) Weight:  [168 lb 14 oz (76.6 kg)] 168 lb 14 oz (76.6 kg) (03/14 0600)  Hemodynamic parameters for last 24 hours: CVP:  [7 mmHg-12 mmHg] 12 mmHg  Intake/Output from previous day: 03/13 0701 - 03/14 0700 In: 6896.7 [I.V.:5066.2; Blood:1420.5; NG/GT:210; IV Piggyback:200] Out: 2390 [Urine:1505; Emesis/NG output:165; Drains:420; Blood:300]  Intake/Output this shift: Total I/O In: 1556.4 [I.V.:1336.4; NG/GT:120; IV Piggyback:100] Out: 800 [Urine:465; Emesis/NG output:140; Drains:195]  Vent settings for last 24 hours: Vent Mode:  [-] PRVC FiO2 (%):  [40 %-100 %] 40 % Set Rate:  [14 bmp] 14 bmp Vt Set:  [570 mL] 570 mL PEEP:  [5 cmH20] 5 cmH20 Plateau Pressure:  [19 cmH20-20 cmH20] 20 cmH20  Physical Exam:  General: on vent Neuro: sedated HEENT/Neck: ETT and collar Resp: clear to auscultation bilaterally CVS: RRR GI: soft, quiet, ND, dressing CDI, JP SS Extremities: no edema, no erythema, pulses WNL  Results for orders placed during the hospital encounter of 12/16/13 (from the past 24 hour(s))  DIC (DISSEMINATED INTRAVASCULAR COAGULATION) PANEL     Status: Abnormal   Collection Time    12/17/13  6:58 AM      Result Value Ref Range   Prothrombin Time 22.2 (*) 11.6 - 15.2 seconds   INR 2.02 (*) 0.00 - 1.49   aPTT 52 (*) 24 - 37 seconds    Fibrinogen 107 (*) 204 - 475 mg/dL   D-Dimer, Quant 0.45  0.00 - 0.48 ug/mL-FEU   Platelets 28 (*) 150 - 400 K/uL   Smear Review NO SCHISTOCYTES SEEN    CBC     Status: Abnormal   Collection Time    12/17/13  7:52 AM      Result Value Ref Range   WBC 11.3 (*) 4.0 - 10.5 K/uL   RBC 3.82 (*) 4.22 - 5.81 MIL/uL   Hemoglobin 11.5 (*) 13.0 - 17.0 g/dL   HCT 40.9 (*) 81.1 - 91.4 %   MCV 85.3  78.0 - 100.0 fL   MCH 30.1  26.0 - 34.0 pg   MCHC 35.3  30.0 - 36.0 g/dL   RDW 78.2  95.6 - 21.3 %   Platelets 45 (*) 150 - 400 K/uL  BASIC METABOLIC PANEL     Status: Abnormal   Collection Time    12/17/13  7:52 AM  Result Value Ref Range   Sodium 141  137 - 147 mEq/L   Potassium 5.2  3.7 - 5.3 mEq/L   Chloride 108  96 - 112 mEq/L   CO2 21  19 - 32 mEq/L   Glucose, Bld 223 (*) 70 - 99 mg/dL   BUN 11  6 - 23 mg/dL   Creatinine, Ser 4.54  0.50 - 1.35 mg/dL   Calcium 6.8 (*) 8.4 - 10.5 mg/dL   GFR calc non Af Amer >90  >90 mL/min   GFR calc Af Amer >90  >90 mL/min  PROTIME-INR     Status: Abnormal   Collection Time    12/17/13  7:52 AM      Result Value Ref Range   Prothrombin Time 18.4 (*) 11.6 - 15.2 seconds   INR 1.58 (*) 0.00 - 1.49  TRIGLYCERIDES     Status: None   Collection Time    12/17/13  7:52 AM      Result Value Ref Range   Triglycerides 75  <150 mg/dL  POCT I-STAT 3, BLOOD GAS (G3+)     Status: Abnormal   Collection Time    12/17/13  8:32 AM      Result Value Ref Range   pH, Arterial 7.338 (*) 7.350 - 7.450   pCO2 arterial 43.0  35.0 - 45.0 mmHg   pO2, Arterial 374.0 (*) 80.0 - 100.0 mmHg   Bicarbonate 23.2  20.0 - 24.0 mEq/L   TCO2 25  0 - 100 mmol/L   O2 Saturation 100.0     Acid-base deficit 3.0 (*) 0.0 - 2.0 mmol/L   Patient temperature 97.6 F     Sample type ARTERIAL    PREPARE PLATELET PHERESIS     Status: None   Collection Time    12/17/13 10:37 AM      Result Value Ref Range   Unit Number [id number]     Blood Component Type PLTPHER LR1     Unit  division 00     Status of Unit ISSUED     Transfusion Status OK TO TRANSFUSE    GLUCOSE, CAPILLARY     Status: Abnormal   Collection Time    12/17/13 11:45 AM      Result Value Ref Range   Glucose-Capillary 152 (*) 70 - 99 mg/dL  BLOOD PRODUCT ORDER (VERBAL) VERIFICATION     Status: None   Collection Time    12/17/13  1:20 PM      Result Value Ref Range   Blood product order confirm MD AUTHORIZATION REQUESTED    CBC     Status: Abnormal   Collection Time    12/17/13  1:53 PM      Result Value Ref Range   WBC 10.3  4.0 - 10.5 K/uL   RBC 3.41 (*) 4.22 - 5.81 MIL/uL   Hemoglobin 10.0 (*) 13.0 - 17.0 g/dL   HCT 62.1 (*) 30.8 - 65.7 %   MCV 83.0  78.0 - 100.0 fL   MCH 29.3  26.0 - 34.0 pg   MCHC 35.3  30.0 - 36.0 g/dL   RDW 84.6  96.2 - 95.2 %   Platelets 123 (*) 150 - 400 K/uL  BLOOD PRODUCT ORDER (VERBAL) VERIFICATION     Status: None   Collection Time    12/17/13  2:00 PM      Result Value Ref Range   Blood product order confirm MD AUTHORIZATION REQUESTED    URINALYSIS, ROUTINE W REFLEX MICROSCOPIC  Status: Abnormal   Collection Time    12/17/13  2:30 PM      Result Value Ref Range   Color, Urine AMBER (*) YELLOW   APPearance CLEAR  CLEAR   Specific Gravity, Urine 1.027  1.005 - 1.030   pH 6.0  5.0 - 8.0   Glucose, UA 100 (*) NEGATIVE mg/dL   Hgb urine dipstick NEGATIVE  NEGATIVE   Bilirubin Urine NEGATIVE  NEGATIVE   Ketones, ur 15 (*) NEGATIVE mg/dL   Protein, ur NEGATIVE  NEGATIVE mg/dL   Urobilinogen, UA 1.0  0.0 - 1.0 mg/dL   Nitrite NEGATIVE  NEGATIVE   Leukocytes, UA NEGATIVE  NEGATIVE  URINE RAPID DRUG SCREEN (HOSP PERFORMED)     Status: Abnormal   Collection Time    12/17/13  2:30 PM      Result Value Ref Range   Opiates POSITIVE (*) NONE DETECTED   Cocaine NONE DETECTED  NONE DETECTED   Benzodiazepines POSITIVE (*) NONE DETECTED   Amphetamines NONE DETECTED  NONE DETECTED   Tetrahydrocannabinol NONE DETECTED  NONE DETECTED   Barbiturates NONE  DETECTED  NONE DETECTED  GLUCOSE, CAPILLARY     Status: None   Collection Time    12/17/13  3:59 PM      Result Value Ref Range   Glucose-Capillary 86  70 - 99 mg/dL  CBC     Status: Abnormal   Collection Time    12/17/13  8:15 PM      Result Value Ref Range   WBC 13.1 (*) 4.0 - 10.5 K/uL   RBC 3.36 (*) 4.22 - 5.81 MIL/uL   Hemoglobin 10.0 (*) 13.0 - 17.0 g/dL   HCT 16.128.0 (*) 09.639.0 - 04.552.0 %   MCV 83.3  78.0 - 100.0 fL   MCH 29.8  26.0 - 34.0 pg   MCHC 35.7  30.0 - 36.0 g/dL   RDW 40.915.5  81.111.5 - 91.415.5 %   Platelets 161  150 - 400 K/uL  GLUCOSE, CAPILLARY     Status: Abnormal   Collection Time    12/17/13  8:17 PM      Result Value Ref Range   Glucose-Capillary 101 (*) 70 - 99 mg/dL   Comment 1 Documented in Chart     Comment 2 Notify RN    GLUCOSE, CAPILLARY     Status: Abnormal   Collection Time    12/17/13 11:57 PM      Result Value Ref Range   Glucose-Capillary 100 (*) 70 - 99 mg/dL   Comment 1 Documented in Chart     Comment 2 Notify RN    CBC     Status: Abnormal   Collection Time    12/18/13  1:53 AM      Result Value Ref Range   WBC 15.4 (*) 4.0 - 10.5 K/uL   RBC 3.19 (*) 4.22 - 5.81 MIL/uL   Hemoglobin 9.7 (*) 13.0 - 17.0 g/dL   HCT 78.226.6 (*) 95.639.0 - 21.352.0 %   MCV 83.4  78.0 - 100.0 fL   MCH 30.4  26.0 - 34.0 pg   MCHC 36.5 (*) 30.0 - 36.0 g/dL   RDW 08.615.6 (*) 57.811.5 - 46.915.5 %   Platelets 156  150 - 400 K/uL  BASIC METABOLIC PANEL     Status: Abnormal   Collection Time    12/18/13  4:12 AM      Result Value Ref Range   Sodium 140  137 - 147 mEq/L  Potassium 3.2 (*) 3.7 - 5.3 mEq/L   Chloride 106  96 - 112 mEq/L   CO2 28  19 - 32 mEq/L   Glucose, Bld 107 (*) 70 - 99 mg/dL   BUN 10  6 - 23 mg/dL   Creatinine, Ser 8.11  0.50 - 1.35 mg/dL   Calcium 7.2 (*) 8.4 - 10.5 mg/dL   GFR calc non Af Amer >90  >90 mL/min   GFR calc Af Amer >90  >90 mL/min  GLUCOSE, CAPILLARY     Status: Abnormal   Collection Time    12/18/13  4:15 AM      Result Value Ref Range    Glucose-Capillary 104 (*) 70 - 99 mg/dL   Comment 1 Notify RN    POCT I-STAT 3, BLOOD GAS (G3+)     Status: Abnormal   Collection Time    12/18/13  4:44 AM      Result Value Ref Range   pH, Arterial 7.416  7.350 - 7.450   pCO2 arterial 42.9  35.0 - 45.0 mmHg   pO2, Arterial 103.0 (*) 80.0 - 100.0 mmHg   Bicarbonate 27.2 (*) 20.0 - 24.0 mEq/L   TCO2 28  0 - 100 mmol/L   O2 Saturation 98.0     Acid-Base Excess 3.0 (*) 0.0 - 2.0 mmol/L   Patient temperature 101.1 F     Collection site ARTERIAL LINE     Drawn by Nurse     Sample type ARTERIAL      Assessment & Plan: Present on Admission:  . Splenic laceration   LOS: 2 days   Additional comments:I reviewed the patient's new clinical lab test results. and CXR [facility] Splenic rupture - S/P splenectomy, S/P re-exploration for hemorrhage Hemorrhagic shock - resolved after resuscitation VDRF - wean to extubate this AM ABL anemia - improved Consumptive coagulopathy - better VTE - PAS FEN -  replete hypokalemia Critical Care Total Time*: 31 Minutes  Violeta Gelinas, MD, MPH, FACS Trauma: [phone] General Surgery: [phone]  12/18/2013  *Care during the described time interval was provided by me. I have reviewed this patient's available data, including medical history, events of note, physical examination and test results as part of my evaluation.

## 2013-12-18 NOTE — Procedures (Signed)
Extubation Procedure Note  Patient Details:   Name: Allen Rivera DOB: 11/02/1990 MRN: [id number]   Airway Documentation:     Evaluation  O2 sats: stable throughout Complications: No apparent complications Patient did tolerate procedure well. Bilateral Breath Sounds: Clear;Diminished Suctioning: Airway Yes FVC-700 IS-500 Placed on 3l/min [place]  Newt LukesGroendal, Duward Allbritton Ann 12/18/2013, 9:29 AM
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
## 2013-12-18 NOTE — Progress Notes (Signed)
Respiratory therapy note- Patient was extubated this morning, and doing well. BBS equal with mild scatt rhonchi, with occasional productive cough. Respiratory protocol assessment performed and nebulizers ordered PRN along with a flutter valve. Patient has been encouraged to cough and deep breath and appropriate equipment given with instruction. Will continue to monitor.

## 2013-12-18 NOTE — ED Provider Notes (Signed)
I have personally seen and examined the patient.  I have discussed the plan of care with the resident.  I have reviewed the documentation on PMH/FH/Soc. History.  I have reviewed the documentation of the resident and agree.  CRITICAL CARE Performed by: Joya GaskinsWICKLINE,Nashia Remus W Total critical care time: 45 Critical care time was exclusive of separately billable procedures and treating other patients. Critical care was necessary to treat or prevent imminent or life-threatening deterioration. Critical care was time spent personally by me on the following activities: development of treatment plan with patient and/or surrogate as well as nursing, discussions with consultants, evaluation of patient's response to treatment, examination of patient, obtaining history from patient or surrogate, ordering and performing treatments and interventions, ordering and review of laboratory studies, ordering and review of radiographic studies, pulse oximetry and re-evaluation of patient's condition.   Joya Gaskinsonald W Monalisa Bayless, MD 12/18/13 [phone]

## 2013-12-19 ENCOUNTER — Encounter (HOSPITAL_COMMUNITY): Payer: Self-pay

## 2013-12-19 LAB — BASIC METABOLIC PANEL
BUN: 6 mg/dL (ref 6–23)
CALCIUM: 8.1 mg/dL — AB (ref 8.4–10.5)
CHLORIDE: 102 meq/L (ref 96–112)
CO2: 28 meq/L (ref 19–32)
Creatinine, Ser: 0.77 mg/dL (ref 0.50–1.35)
GFR calc Af Amer: >90 mL/min (ref >90)
GFR calc non Af Amer: >90 mL/min (ref >90)
Glucose, Bld: 102 mg/dL — ABNORMAL HIGH (ref 70–99)
Potassium: 4 mEq/L (ref 3.7–5.3)
Sodium: 138 mEq/L (ref 137–147)

## 2013-12-19 LAB — CBC
HCT: 27.8 % — ABNORMAL LOW (ref 39.0–52.0)
Hemoglobin: 9.7 g/dL — ABNORMAL LOW (ref 13.0–17.0)
MCH: 30.2 pg (ref 26.0–34.0)
MCHC: 34.9 g/dL (ref 30.0–36.0)
MCV: 86.6 fL (ref 78.0–100.0)
PLATELETS: 260 10*3/uL (ref 150–400)
RBC: 3.21 MIL/uL — AB (ref 4.22–5.81)
RDW: 15.4 % (ref 11.5–15.5)
WBC: 22.1 10*3/uL — AB (ref 4.0–10.5)

## 2013-12-19 LAB — POCT I-STAT 4, (NA,K, GLUC, HGB,HCT)
Glucose, Bld: 325 mg/dL — ABNORMAL HIGH (ref 70–99)
HCT: 20 % — ABNORMAL LOW (ref 39.0–52.0)
HEMOGLOBIN: 6.8 g/dL — AB (ref 13.0–17.0)
Potassium: 5.4 mEq/L — ABNORMAL HIGH (ref 3.7–5.3)
SODIUM: 140 meq/L (ref 137–147)

## 2013-12-19 NOTE — Progress Notes (Signed)
Patient ID: Allen Rivera, male   DOB: Aug 03, 1991, 23 y.o.   MRN: [id number] [facility] Surgery Progress Note:   2 Days Post-Op  Subjective: Mental status is awake;  Some confusion and misunderstanding between family and patient.   Objective: Vital signs in last 24 hours: Temp:  [98.1 F (36.7 C)-100 F (37.8 C)] 98.1 F (36.7 C) (03/15 0400) Pulse Rate:  [92-135] 93 (03/15 0700) Resp:  [10-22] 10 (03/15 0700) BP: (115-146)/(53-85) 130/53 mmHg (03/15 0700) SpO2:  [94 %-100 %] 95 % (03/15 0700) Arterial Line BP: (83-160)/(68-103) 98/92 mmHg (03/15 0700)  Intake/Output from previous day: 03/14 0701 - 03/15 0700 In: 2880 [I.V.:2000; NG/GT:120; IV Piggyback:760] Out: 3490 [Urine:3355; Drains:135] Intake/Output this shift: Total I/O In: -  Out: 150 [Urine:150]  Physical Exam: Work of breathing is not labored.  Complaining of cervical collar and wants it removed.  CT neck ok-remove collar.  NG with orange drainage.  Will maintain but allow ice chips.  Abdomen incision is covered.   Lab Results:  Results for orders placed during the hospital encounter of 12/16/13 (from the past 48 hour(s))  PREPARE PLATELET PHERESIS     Status: None   Collection Time    12/17/13 10:37 AM      Result Value Ref Range   Unit Number [id number]     Blood Component Type PLTPHER LR1     Unit division 00     Status of Unit ISSUED,FINAL     Transfusion Status OK TO TRANSFUSE    GLUCOSE, CAPILLARY     Status: Abnormal   Collection Time    12/17/13 11:45 AM      Result Value Ref Range   Glucose-Capillary 152 (*) 70 - 99 mg/dL  BLOOD PRODUCT ORDER (VERBAL) VERIFICATION     Status: None   Collection Time    12/17/13  1:20 PM      Result Value Ref Range   Blood product order confirm MD AUTHORIZATION REQUESTED    CBC     Status: Abnormal   Collection Time    12/17/13  1:53 PM      Result Value Ref Range   WBC 10.3  4.0 - 10.5 K/uL   RBC 3.41 (*) 4.22 - 5.81 MIL/uL   Hemoglobin 10.0 (*) 13.0 -  17.0 g/dL   HCT 28.3 (*) 39.0 - 52.0 %   MCV 83.0  78.0 - 100.0 fL   MCH 29.3  26.0 - 34.0 pg   MCHC 35.3  30.0 - 36.0 g/dL   RDW 15.2  11.5 - 15.5 %   Platelets 123 (*) 150 - 400 K/uL   Comment: SPECIMEN CHECKED FOR CLOTS     REPEATED TO VERIFY     DELTA CHECK NOTED     POST TRANSFUSION SPECIMEN  BLOOD PRODUCT ORDER (VERBAL) VERIFICATION     Status: None   Collection Time    12/17/13  2:00 PM      Result Value Ref Range   Blood product order confirm MD AUTHORIZATION REQUESTED    URINALYSIS, ROUTINE W REFLEX MICROSCOPIC     Status: Abnormal   Collection Time    12/17/13  2:30 PM      Result Value Ref Range   Color, Urine AMBER (*) YELLOW   Comment: BIOCHEMICALS MAY BE AFFECTED BY COLOR   APPearance CLEAR  CLEAR   Specific Gravity, Urine 1.027  1.005 - 1.030   pH 6.0  5.0 - 8.0   Glucose, UA 100 (*) NEGATIVE  mg/dL   Hgb urine dipstick NEGATIVE  NEGATIVE   Bilirubin Urine NEGATIVE  NEGATIVE   Ketones, ur 15 (*) NEGATIVE mg/dL   Protein, ur NEGATIVE  NEGATIVE mg/dL   Urobilinogen, UA 1.0  0.0 - 1.0 mg/dL   Nitrite NEGATIVE  NEGATIVE   Leukocytes, UA NEGATIVE  NEGATIVE   Comment: MICROSCOPIC NOT DONE ON URINES WITH NEGATIVE PROTEIN, BLOOD, LEUKOCYTES, NITRITE, OR GLUCOSE <1000 mg/dL.  URINE RAPID DRUG SCREEN (HOSP PERFORMED)     Status: Abnormal   Collection Time    12/17/13  2:30 PM      Result Value Ref Range   Opiates POSITIVE (*) NONE DETECTED   Cocaine NONE DETECTED  NONE DETECTED   Benzodiazepines POSITIVE (*) NONE DETECTED   Amphetamines NONE DETECTED  NONE DETECTED   Tetrahydrocannabinol NONE DETECTED  NONE DETECTED   Barbiturates NONE DETECTED  NONE DETECTED   Comment:            DRUG SCREEN FOR MEDICAL PURPOSES     ONLY.  IF CONFIRMATION IS NEEDED     FOR ANY PURPOSE, NOTIFY LAB     WITHIN 5 DAYS.                LOWEST DETECTABLE LIMITS     FOR URINE DRUG SCREEN     Drug Class       Cutoff (ng/mL)     Amphetamine      1000     Barbiturate      200      Benzodiazepine   655     Tricyclics       374     Opiates          300     Cocaine          300     THC              50  GLUCOSE, CAPILLARY     Status: None   Collection Time    12/17/13  3:59 PM      Result Value Ref Range   Glucose-Capillary 86  70 - 99 mg/dL  CBC     Status: Abnormal   Collection Time    12/17/13  8:15 PM      Result Value Ref Range   WBC 13.1 (*) 4.0 - 10.5 K/uL   RBC 3.36 (*) 4.22 - 5.81 MIL/uL   Hemoglobin 10.0 (*) 13.0 - 17.0 g/dL   HCT 28.0 (*) 39.0 - 52.0 %   MCV 83.3  78.0 - 100.0 fL   MCH 29.8  26.0 - 34.0 pg   MCHC 35.7  30.0 - 36.0 g/dL   RDW 15.5  11.5 - 15.5 %   Platelets 161  150 - 400 K/uL   Comment: REPEATED TO VERIFY  GLUCOSE, CAPILLARY     Status: Abnormal   Collection Time    12/17/13  8:17 PM      Result Value Ref Range   Glucose-Capillary 101 (*) 70 - 99 mg/dL   Comment 1 Documented in Chart     Comment 2 Notify RN    GLUCOSE, CAPILLARY     Status: Abnormal   Collection Time    12/17/13 11:57 PM      Result Value Ref Range   Glucose-Capillary 100 (*) 70 - 99 mg/dL   Comment 1 Documented in Chart     Comment 2 Notify RN    CBC     Status: Abnormal  Collection Time    12/18/13  1:53 AM      Result Value Ref Range   WBC 15.4 (*) 4.0 - 10.5 K/uL   RBC 3.19 (*) 4.22 - 5.81 MIL/uL   Hemoglobin 9.7 (*) 13.0 - 17.0 g/dL   HCT 26.6 (*) 39.0 - 52.0 %   MCV 83.4  78.0 - 100.0 fL   MCH 30.4  26.0 - 34.0 pg   MCHC 36.5 (*) 30.0 - 36.0 g/dL   RDW 15.6 (*) 11.5 - 15.5 %   Platelets 156  150 - 400 K/uL  BASIC METABOLIC PANEL     Status: Abnormal   Collection Time    12/18/13  4:12 AM      Result Value Ref Range   Sodium 140  137 - 147 mEq/L   Potassium 3.2 (*) 3.7 - 5.3 mEq/L   Comment: DELTA CHECK NOTED   Chloride 106  96 - 112 mEq/L   CO2 28  19 - 32 mEq/L   Glucose, Bld 107 (*) 70 - 99 mg/dL   BUN 10  6 - 23 mg/dL   Creatinine, Ser 1.04  0.50 - 1.35 mg/dL   Calcium 7.2 (*) 8.4 - 10.5 mg/dL   GFR calc non Af Amer >90  >90  mL/min   GFR calc Af Amer >90  >90 mL/min   Comment: (NOTE)     The eGFR has been calculated using the CKD EPI equation.     This calculation has not been validated in all clinical situations.     eGFR's persistently <90 mL/min signify possible Chronic Kidney     Disease.  GLUCOSE, CAPILLARY     Status: Abnormal   Collection Time    12/18/13  4:15 AM      Result Value Ref Range   Glucose-Capillary 104 (*) 70 - 99 mg/dL   Comment 1 Notify RN    POCT I-STAT 3, BLOOD GAS (G3+)     Status: Abnormal   Collection Time    12/18/13  4:44 AM      Result Value Ref Range   pH, Arterial 7.416  7.350 - 7.450   pCO2 arterial 42.9  35.0 - 45.0 mmHg   pO2, Arterial 103.0 (*) 80.0 - 100.0 mmHg   Bicarbonate 27.2 (*) 20.0 - 24.0 mEq/L   TCO2 28  0 - 100 mmol/L   O2 Saturation 98.0     Acid-Base Excess 3.0 (*) 0.0 - 2.0 mmol/L   Patient temperature 101.1 F     Collection site ARTERIAL LINE     Drawn by Nurse     Sample type ARTERIAL    CBC     Status: Abnormal   Collection Time    12/18/13  8:00 AM      Result Value Ref Range   WBC 19.7 (*) 4.0 - 10.5 K/uL   RBC 3.22 (*) 4.22 - 5.81 MIL/uL   Hemoglobin 9.4 (*) 13.0 - 17.0 g/dL   HCT 27.2 (*) 39.0 - 52.0 %   MCV 84.5  78.0 - 100.0 fL   MCH 29.2  26.0 - 34.0 pg   MCHC 34.6  30.0 - 36.0 g/dL   RDW 15.6 (*) 11.5 - 15.5 %   Platelets 188  150 - 400 K/uL  GLUCOSE, CAPILLARY     Status: None   Collection Time    12/18/13  3:59 PM      Result Value Ref Range   Glucose-Capillary 91  70 - 99 mg/dL  CBC     Status: Abnormal   Collection Time    12/19/13  4:00 AM      Result Value Ref Range   WBC 22.1 (*) 4.0 - 10.5 K/uL   RBC 3.21 (*) 4.22 - 5.81 MIL/uL   Hemoglobin 9.7 (*) 13.0 - 17.0 g/dL   HCT 27.8 (*) 39.0 - 52.0 %   MCV 86.6  78.0 - 100.0 fL   MCH 30.2  26.0 - 34.0 pg   MCHC 34.9  30.0 - 36.0 g/dL   RDW 15.4  11.5 - 15.5 %   Platelets 260  150 - 400 K/uL   Comment: DELTA CHECK NOTED  BASIC METABOLIC PANEL     Status: Abnormal    Collection Time    12/19/13  4:00 AM      Result Value Ref Range   Sodium 138  137 - 147 mEq/L   Potassium 4.0  3.7 - 5.3 mEq/L   Comment: DELTA CHECK NOTED   Chloride 102  96 - 112 mEq/L   CO2 28  19 - 32 mEq/L   Glucose, Bld 102 (*) 70 - 99 mg/dL   BUN 6  6 - 23 mg/dL   Creatinine, Ser 0.77  0.50 - 1.35 mg/dL   Calcium 8.1 (*) 8.4 - 10.5 mg/dL   GFR calc non Af Amer >90  >90 mL/min   GFR calc Af Amer >90  >90 mL/min   Comment: (NOTE)     The eGFR has been calculated using the CKD EPI equation.     This calculation has not been validated in all clinical situations.     eGFR's persistently <90 mL/min signify possible Chronic Kidney     Disease.    Radiology/Results: No results found.  Anti-infectives: Anti-infectives   Start     Dose/Rate Route Frequency Ordered Stop   12/17/13 0000  ceFAZolin (ANCEF) IVPB 2 g/50 mL premix     2 g 100 mL/hr over 30 Minutes Intravenous 3 times per day 12/16/13 2352        Assessment/Plan: Problem List: Patient Active Problem List   Diagnosis Date Noted  . Splenic laceration 12/16/2013    HG is stable;  Will get up into chair.  Try ice chips.   2 Days Post-Op    LOS: 3 days   Matt B. Hassell Done, MD, [facility] [phone] beeper [phone]  12/19/2013 9:47 AM

## 2013-12-19 NOTE — Progress Notes (Signed)
Weekend CSW attempted to assess and complete SBIRT. Patient appeared lethargic and possibly sedated at time of assessment, not able to participate. Trauma CSW will complete assessment at later time when patient is able to participate.   Samuella BruinKristin Shandrea Lusk, MSW, LCSWA Clinical Social Worker [facility] [phone]

## 2013-12-20 LAB — TYPE AND SCREEN
ABO/RH(D): O NEG
ANTIBODY SCREEN: NEGATIVE
UNIT DIVISION: 0
UNIT DIVISION: 0
UNIT DIVISION: 0
UNIT DIVISION: 0
UNIT DIVISION: 0
UNIT DIVISION: 0
UNIT DIVISION: 0
Unit division: 0
Unit division: 0
Unit division: 0
Unit division: 0
Unit division: 0
Unit division: 0
Unit division: 0
Unit division: 0
Unit division: 0
Unit division: 0

## 2013-12-20 MED ORDER — BOOST / RESOURCE BREEZE PO LIQD
1.0000 | Freq: Three times a day (TID) | ORAL | Status: DC
  Administered 2013-12-20 – 2013-12-24 (×9): 1 via ORAL
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
## 2013-12-20 NOTE — Progress Notes (Signed)
Spoke with Dr. Daphine DeutscherMartin in morning rounds regarding care of patient's abdominal dressing.  To leave original surgical dressing in place per MD at this time.  Tommi EmeryHINTZ, Leisa Gault M

## 2013-12-20 NOTE — Progress Notes (Signed)
Met with pt and completed SBIRT.  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  He admits to daily use and believes it would be impossible for him to quit using.  CSW will inform Trauma CSW to f/u with pt with SA resources.  Pt was not clear about his desire to stop abusing opiates/seek tx during interview.
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
## 2013-12-20 NOTE — Progress Notes (Signed)
Trauma Service Note  Subjective: Patient is sitting up in the chair.  Conversant.  Alert.  Wants to eat.  Objective: Vital signs in last 24 hours: Temp:  [97 F (36.1 C)-98.9 F (37.2 C)] 98.4 F (36.9 C) (03/16 0826) Pulse Rate:  [88-107] 93 (03/16 0700) Resp:  [10-19] 13 (03/16 0700) BP: (131-142)/(69-89) 139/79 mmHg (03/16 0700) SpO2:  [95 %-100 %] 97 % (03/16 0700) Arterial Line BP: (89-114)/(82-109) 101/93 mmHg (03/15 1700)    Intake/Output from previous day: 03/15 0701 - 03/16 0700 In: 2790 [P.O.:90; I.V.:2400; NG/GT:90; IV Piggyback:210] Out: 3705 [Urine:3450; Emesis/NG output:175; Drains:80] Intake/Output this shift:    General: No acute distress, but has been needing a lot of pain medications.  Lungs: Clear  Abd: Very few bowel sounds.  Extremities: No clinical signs or symptoms of DVT.  Neuro: Intact  Lab Results: CBC   Recent Labs  12/18/13 0800 12/19/13 0400  WBC 19.7* 22.1*  HGB 9.4* 9.7*  HCT 27.2* 27.8*  PLT 188 260   BMET  Recent Labs  12/18/13 0412 12/19/13 0400  NA 140 138  K 3.2* 4.0  CL 106 102  CO2 28 28  GLUCOSE 107* 102*  BUN 10 6  CREATININE 1.04 0.77  CALCIUM 7.2* 8.1*   PT/INR No results found for this basename: LABPROT, INR,  in the last 72 hours ABG  Recent Labs  12/18/13 0444  PHART 7.416  HCO3 27.2*    Studies/Results: No results found.  Anti-infectives: Anti-infectives   Start     Dose/Rate Route Frequency Ordered Stop   12/17/13 0000  ceFAZolin (ANCEF) IVPB 2 g/50 mL premix     2 g 100 mL/hr over 30 Minutes Intravenous 3 times per day 12/16/13 2352        Assessment/Plan: s/p Procedure(s): EXPLORATORY LAPAROTOMY suture ligation of bleeding vessel d/c foley Advance diet Transfer to the floor. Sips of clear liquids.  LOS: 4 days   Marta LamasJames O. Gae BonWyatt, III, MD, FACS [phone] Trauma Surgeon 12/20/2013

## 2013-12-20 NOTE — Progress Notes (Signed)
Patient transferred via wheelchair to [phone] with all of patient's belongings.  IV/PCA and SCDs also transferred to new unit.  Meds and patient chart left with the secretary of 6N unit.  Patient alert, oriented and VSS at time of transfer and left in the care of Okey Regalarol, [place].  Tommi EmeryHINTZ, Dinita Migliaccio M

## 2013-12-21 ENCOUNTER — Encounter (HOSPITAL_COMMUNITY): Payer: Self-pay | Admitting: General Surgery

## 2013-12-21 DIAGNOSIS — D62 Acute posthemorrhagic anemia: Secondary | ICD-10-CM

## 2013-12-21 LAB — CBC WITH DIFFERENTIAL/PLATELET
BASOS PCT: 0 % (ref 0–1)
Basophils Absolute: 0 10*3/uL (ref 0.0–0.1)
Eosinophils Absolute: 0.4 10*3/uL (ref 0.0–0.7)
Eosinophils Relative: 3 % (ref 0–5)
HCT: 28.8 % — ABNORMAL LOW (ref 39.0–52.0)
HEMOGLOBIN: 9.7 g/dL — AB (ref 13.0–17.0)
LYMPHS PCT: 20 % (ref 12–46)
Lymphs Abs: 3.2 10*3/uL (ref 0.7–4.0)
MCH: 29.1 pg (ref 26.0–34.0)
MCHC: 33.7 g/dL (ref 30.0–36.0)
MCV: 86.5 fL (ref 78.0–100.0)
MONOS PCT: 17 % — AB (ref 3–12)
Monocytes Absolute: 2.7 10*3/uL — ABNORMAL HIGH (ref 0.1–1.0)
NEUTROS ABS: 9.7 10*3/uL — AB (ref 1.7–7.7)
Neutrophils Relative %: 61 % (ref 43–77)
Platelets: 662 10*3/uL — ABNORMAL HIGH (ref 150–400)
RBC: 3.33 MIL/uL — ABNORMAL LOW (ref 4.22–5.81)
RDW: 14.1 % (ref 11.5–15.5)
WBC: 16 10*3/uL — ABNORMAL HIGH (ref 4.0–10.5)

## 2013-12-21 LAB — BASIC METABOLIC PANEL
BUN: 3 mg/dL — AB (ref 6–23)
CHLORIDE: 100 meq/L (ref 96–112)
CO2: 29 meq/L (ref 19–32)
CREATININE: 0.62 mg/dL (ref 0.50–1.35)
Calcium: 8.4 mg/dL (ref 8.4–10.5)
GFR calc non Af Amer: >90 mL/min (ref >90)
Glucose, Bld: 112 mg/dL — ABNORMAL HIGH (ref 70–99)
POTASSIUM: 3.6 meq/L — AB (ref 3.7–5.3)
Sodium: 140 mEq/L (ref 137–147)

## 2013-12-21 MED ORDER — POLYETHYLENE GLYCOL 3350 17 G PO PACK
17.0000 g | PACK | Freq: Two times a day (BID) | ORAL | Status: DC
  Administered 2013-12-21 – 2013-12-24 (×6): 17 g via ORAL
  Filled 2013-12-21 (×12): qty 1

## 2013-12-21 MED ORDER — HYDROMORPHONE HCL PF 1 MG/ML IJ SOLN
1.0000 mg | INTRAMUSCULAR | Status: DC | PRN
  Administered 2013-12-21 (×4): 1 mg via INTRAVENOUS
  Filled 2013-12-21 (×4): qty 1

## 2013-12-21 MED ORDER — HYDROMORPHONE HCL PF 1 MG/ML IJ SOLN
1.0000 mg | INTRAMUSCULAR | Status: DC | PRN
  Administered 2013-12-21 – 2013-12-22 (×8): 1 mg via INTRAVENOUS
  Filled 2013-12-21 (×8): qty 1

## 2013-12-21 MED ORDER — OXYCODONE-ACETAMINOPHEN 5-325 MG PO TABS
1.0000 | ORAL_TABLET | ORAL | Status: DC | PRN
  Administered 2013-12-21 – 2013-12-22 (×6): 2 via ORAL
  Filled 2013-12-21 (×6): qty 2

## 2013-12-21 NOTE — Progress Notes (Signed)
DOing better. Transition to oral pain meds. I counseled him to not resume smoking at D/C. He is motivated to "stop partying and being wild". I encouraged him to get a job after recovering from surgery. Patient examined and I agree with the assessment and plan  Violeta GelinasBurke Davaughn Hillyard, MD, MPH, FACS Trauma: [phone] General Surgery: [phone]  12/21/2013 12:58 PM

## 2013-12-21 NOTE — Clinical Social Work Note (Signed)
Clinical Social Worker continuing to follow patient and family for support and current substance use.  CSW provided patient with residential and outpatient resources for opiate abuse.  Patient did not express motivation for change, however did accept resources.  Patient plans to discharge home with his grandmother once medically ready.  Clinical Social Worker will sign off for now as social work intervention is no longer needed. Please consult us again if new need arises.  Macario GoldsJesse Araly Kaas, [place] [phone]

## 2013-12-21 NOTE — Progress Notes (Signed)
Patient ID: Allen MaffucciSy Everhart, male   DOB: 12/29/1990, 23 y.o.   MRN: [id number]   Subjective: Passing flatus, no BM.  Tolerating clears.  Voiding.    Objective:  Vital signs:  Filed Vitals:   12/21/13 0232 12/21/13 0400 12/21/13 0458 12/21/13 0652  BP: 131/67   132/75  Pulse: 100   96  Temp: 100.1 F (37.8 C)   98.8 F (37.1 C)  TempSrc: Oral   Oral  Resp: 17 15 18 16   Height:      Weight:      SpO2: 96% 96% 94% 94%    Last BM Date:  (prior to admission)  Intake/Output   Yesterday:  03/16 0701 - 03/17 0700 In: 3565 [P.O.:480; I.V.:2785; NG/GT:30; IV Piggyback:270] Out: 1935 [Urine:1925; Drains:10]     Physical Exam: General: Pt awake/alert/oriented x3 in no acute distress Chest: cta.  No chest wall pain w good excursion CV:  Pulses intact.  Regular rhythm Abdomen: Soft.  Mildly distended.  Appropriately tender.  Midline incision is c/d/i.  JP drain with little serous output  No evidence of peritonitis.  No incarcerated hernias. Ext:  SCDs BLE.  No mjr edema.  No cyanosis Skin: No petechiae / purpura   Problem List:   Active Problems:   Splenic laceration    Results:   Labs: No results found for this or any previous visit (from the past 48 hour(s)).  Imaging / Studies: No results found.  Medications / Allergies: per chart  Antibiotics: Anti-infectives   Start     Dose/Rate Route Frequency Ordered Stop   12/17/13 0000  ceFAZolin (ANCEF) IVPB 2 g/50 mL premix     2 g 100 mL/hr over 30 Minutes Intravenous 3 times per day 12/16/13 2352        Assessment/Plan [facility]  Splenic rupture - S/P splenectomy, S/P re-exploration for hemorrhage POD#4 Advance to full liquid diet Add miralax Mobilize IS(102600ml) Add oral pain meds, continue with PCA this AM, will reassess this afternoon  Daily dressing changes Abdominal binder ?remove drain Hemorrhagic shock - resolved VDRF -resolved  ABL anemia -stable VTE - SCDs, ambulate  FEN - advance diet, transition to  oral pain meds. Caution opiate abuse history   Ashok NorrisEmina Lavanna Rog, [facility] IncNP-BC [facility] Pager [phone] Office [phone]  12/21/2013 8:10 AM

## 2013-12-22 LAB — CBC
HEMATOCRIT: 28.4 % — AB (ref 39.0–52.0)
HEMOGLOBIN: 9.9 g/dL — AB (ref 13.0–17.0)
MCH: 30 pg (ref 26.0–34.0)
MCHC: 34.9 g/dL (ref 30.0–36.0)
MCV: 86.1 fL (ref 78.0–100.0)
Platelets: 820 10*3/uL — ABNORMAL HIGH (ref 150–400)
RBC: 3.3 MIL/uL — ABNORMAL LOW (ref 4.22–5.81)
RDW: 13.8 % (ref 11.5–15.5)
WBC: 15.6 10*3/uL — ABNORMAL HIGH (ref 4.0–10.5)

## 2013-12-22 MED ORDER — TRAMADOL HCL 50 MG PO TABS
100.0000 mg | ORAL_TABLET | Freq: Four times a day (QID) | ORAL | Status: DC
  Administered 2013-12-22 – 2013-12-24 (×10): 100 mg via ORAL
  Filled 2013-12-22 (×10): qty 2

## 2013-12-22 MED ORDER — NAPROXEN 500 MG PO TABS
500.0000 mg | ORAL_TABLET | Freq: Two times a day (BID) | ORAL | Status: DC
Start: 2013-12-22 — End: 2013-12-24
  Administered 2013-12-22 – 2013-12-24 (×4): 500 mg via ORAL
  Filled 2013-12-22 (×7): qty 1

## 2013-12-22 MED ORDER — HYDROMORPHONE HCL PF 1 MG/ML IJ SOLN
0.5000 mg | INTRAMUSCULAR | Status: DC | PRN
  Administered 2013-12-22 – 2013-12-24 (×9): 0.5 mg via INTRAVENOUS
  Filled 2013-12-22 (×9): qty 1

## 2013-12-22 MED ORDER — OXYCODONE HCL 5 MG PO TABS
10.0000 mg | ORAL_TABLET | ORAL | Status: DC | PRN
  Administered 2013-12-22 – 2013-12-24 (×13): 20 mg via ORAL
  Filled 2013-12-22 (×13): qty 4

## 2013-12-22 MED ORDER — BENZOCAINE 10 % MT GEL
Freq: Four times a day (QID) | OROMUCOSAL | Status: DC | PRN
  Administered 2013-12-22: 21:00:00 via OROMUCOSAL
  Filled 2013-12-22: qty 9.4

## 2013-12-22 NOTE — Progress Notes (Signed)
Pt refusing to ambulate. Explained benefits of ambulation and risks associated with not ambulating. Will continue to encourage.

## 2013-12-22 NOTE — Progress Notes (Signed)
Patient ID: Allen Rivera, male   DOB: 10/05/1991, 23 y.o.   MRN: [id number]   LOS: 6 days  POD#5  Subjective: C/o undertreated pain. Denies N/V. +flatus.   Objective: Vital signs in last 24 hours: Temp:  [98 F (36.7 C)-98.3 F (36.8 C)] 98.3 F (36.8 C) (03/18 0609) Pulse Rate:  [90-100] 93 (03/18 0609) Resp:  [13-17] 17 (03/18 0609) BP: (130-138)/(79-87) 130/81 mmHg (03/18 0609) SpO2:  [93 %-100 %] 100 % (03/18 0609) Last BM Date:  (pta)   JP: 18450ml/24h   Laboratory  CBC  Recent Labs  12/21/13 0655 12/22/13 0500  WBC 16.0* 15.6*  HGB 9.7* 9.9*  HCT 28.8* 28.4*  PLT 662* 820*    Physical Exam General appearance: alert and no distress Resp: clear to auscultation bilaterally Cardio: regular rate and rhythm GI: Soft, diminished BS, incision C/D/I   [facility]  Splenic rupture s/p splenectomy, s/p re-exploration for hemorrhage -- Advance to reg, continue JP. Will need vaccines at discharge. ABL anemia -stable  VTE - SCDs, ambulate  FEN - Increase oxyIR, add scheduled tramadol and NSAID Dispo -- Home tomorrow if tolerates diet, pain controlled    Freeman CaldronMichael J. Aspen Deterding, PA-C Pager: [phone] General Trauma PA Pager: [phone]  12/22/2013

## 2013-12-22 NOTE — Progress Notes (Signed)
Pain control is a problem.  Will not increase very mcuh at all.  Marta LamasJames O. Gae BonWyatt, III, MD, FACS [phone] Trauma Surgeon

## 2013-12-23 DIAGNOSIS — Q8901 Asplenia (congenital): Secondary | ICD-10-CM

## 2013-12-23 DIAGNOSIS — D62 Acute posthemorrhagic anemia: Secondary | ICD-10-CM | POA: Diagnosis not present

## 2013-12-23 DIAGNOSIS — IMO0002 Reserved for concepts with insufficient information to code with codable children: Secondary | ICD-10-CM

## 2013-12-23 MED ORDER — MORPHINE SULFATE ER 15 MG PO TBCR
15.0000 mg | EXTENDED_RELEASE_TABLET | Freq: Three times a day (TID) | ORAL | Status: DC
  Administered 2013-12-23 – 2013-12-24 (×5): 15 mg via ORAL
  Filled 2013-12-23 (×5): qty 1

## 2013-12-23 NOTE — Progress Notes (Signed)
Pain control is the primary issue.  This patient has been seen and I agree with the findings and treatment plan.  Marta LamasJames O. Gae BonWyatt, III, MD, FACS [phone] (pager) [phone] (direct pager) Trauma Surgeon

## 2013-12-23 NOTE — Progress Notes (Signed)
Patient ID: Simonne MaffucciSy Durbin, male   DOB: 08/31/1991, 23 y.o.   MRN: [id number]   LOS: 7 days  POD#6  Subjective: C/o pain, says oxyIR working but not long enough.   Objective: Vital signs in last 24 hours: Temp:  [97.8 F (36.6 C)-98.5 F (36.9 C)] 97.8 F (36.6 C) (03/19 0648) Pulse Rate:  [76-95] 76 (03/19 0648) Resp:  [16-19] 16 (03/19 0648) BP: (124-140)/(67-88) 131/88 mmHg (03/19 0648) SpO2:  [95 %-97 %] 97 % (03/19 0648) Last BM Date:  (PTA)   JP: 28355ml/24h   Physical Exam General appearance: alert and no distress Resp: clear to auscultation bilaterally Cardio: regular rate and rhythm GI: Soft, incision C/D/I, serosanguinous fluid in JP   Ass[facility]Plan[facility]  Splenic rupture s/p splenectomy, s/p re-exploration for hemorrhage -- Continue JP. Will need vaccines at discharge.  ABL anemia -stable  FEN - Add MSContin VTE - SCDs, ambulate  Dispo -- Home once pain controlled    Freeman CaldronMichael J. Trammell Bowden, PA-C Pager: [phone] General Trauma PA Pager: [phone]  12/23/2013

## 2013-12-23 NOTE — Op Note (Signed)
PRE-OPERATIVE DIAGNOSIS: bleeding following splenectomy   POST-OPERATIVE DIAGNOSIS:  Same  PROCEDURE:  Procedure(s): Exploratory laparotomy, evacuation of hematoma, oversew bleeding vessels.    SURGEON:  Surgeon(s): Almond LintFaera Araf Clugston, MD  ASSISTANT: Violeta GelinasBurke Thompson, MD  ANESTHESIA:   general  DRAINS: (19 Fr) Blake drain(s) in the LUQ   LOCAL MEDICATIONS USED:  NONE  SPECIMEN:  No Specimen  DISPOSITION OF SPECIMEN:  N/A  COUNTS:  YES  DICTATION: .Dragon Dictation  PLAN OF CARE: Admit to inpatient   PATIENT DISPOSITION:  ICU - intubated and critically ill.  EBL:  1 L hematoma, 200 mL additional bleeding  FINDINGS:   Hemoperitoneum.  Small ooze from short gastric.  Small ooze from retroperitoneal bed.  No evidence of bleeding of splenic artery/vein.    PROCEDURE:   Pt was identified in the ICU and taken straight back to the OR and placed supine on the OR table.  General anesthesia was induced.  The abdomen was prepped and draped sterilely.  Time out was performed according to the surgical safety list.  When all was correct, we continued.   The staples were removed, and the fascial sutures were removed.  The hemoperitoneum was evacuated.  The abdomen was repacked.  The abdomen was examined sequentially.  The splenic hilum was intact.  There were a few small areas of oozing in the retroperitoneum, the stomach edge, and the short gastrics.  There was no significant area seen.    These areas were suture ligated.  The abdomen was reirrigated.  A drain was placed in the LUQ near the pancreatic tail. The fascia was closed with #1 running PDS sutures, and the skin was reapproximated with staples.  The patient was hemodynamically unstable, so we waiting around 20 minutes in the OR before allowing him to emerge from anesthesia to make sure he stabilized off neosynephrine.  He was stable, so he was taken back to the ICU.

## 2013-12-24 DIAGNOSIS — R578 Other shock: Secondary | ICD-10-CM

## 2013-12-24 DIAGNOSIS — J9600 Acute respiratory failure, unspecified whether with hypoxia or hypercapnia: Secondary | ICD-10-CM

## 2013-12-24 DIAGNOSIS — S3609XA Other injury of spleen, initial encounter: Secondary | ICD-10-CM

## 2013-12-24 DIAGNOSIS — Z9081 Acquired absence of spleen: Secondary | ICD-10-CM

## 2013-12-24 LAB — MISCELLANEOUS TEST: MISCELLANEOUS TEST RESULTS: 29

## 2013-12-24 LAB — AMYLASE, PERITONEAL FLUID: AMYLASE, PERITONEAL FLUID: 27 U/L

## 2013-12-24 MED ORDER — PNEUMOCOCCAL 13-VAL CONJ VACC IM SUSP
0.5000 mL | Freq: Once | INTRAMUSCULAR | Status: AC
Start: 2013-12-24 — End: 2013-12-24
  Administered 2013-12-24: 0.5 mL via INTRAMUSCULAR
  Filled 2013-12-24 (×3): qty 0.5

## 2013-12-24 MED ORDER — TRAMADOL HCL 50 MG PO TABS: 100.0000 mg | ORAL_TABLET | Freq: Four times a day (QID) | ORAL | Status: DC

## 2013-12-24 MED ORDER — PNEUMOCOCCAL 13-VAL CONJ VACC IM SUSP: 0.5000 mL | INTRAMUSCULAR | Status: DC

## 2013-12-24 MED ORDER — MORPHINE SULFATE ER 15 MG PO TBCR
EXTENDED_RELEASE_TABLET | ORAL | Status: DC
Start: 2013-12-24 — End: 2014-04-05

## 2013-12-24 MED ORDER — NAPROXEN 500 MG PO TABS: 500.0000 mg | ORAL_TABLET | Freq: Two times a day (BID) | ORAL | Status: DC

## 2013-12-24 MED ORDER — MENINGOCOCCAL A C Y&W-135 OLIG IM SOLR
0.5000 mL | Freq: Once | INTRAMUSCULAR | Status: AC
  Administered 2013-12-24: 0.5 mL via INTRAMUSCULAR
  Filled 2013-12-24: qty 0.5

## 2013-12-24 MED ORDER — OXYCODONE-ACETAMINOPHEN 10-325 MG PO TABS: 1.0000 | ORAL_TABLET | ORAL | Status: DC | PRN

## 2013-12-24 MED ORDER — MORPHINE SULFATE ER 15 MG PO TBCR: EXTENDED_RELEASE_TABLET | ORAL | Status: DC

## 2013-12-24 MED ORDER — HAEMOPHILUS B POLYSAC CONJ VAC IM SOLR
0.5000 mL | Freq: Once | INTRAMUSCULAR | Status: AC
  Administered 2013-12-24: 0.5 mL via INTRAMUSCULAR
  Filled 2013-12-24: qty 0.5

## 2013-12-24 NOTE — Discharge Instructions (Signed)
Wash wounds daily in shower with soap and water. Do not soak. Apply antibiotic ointment (e.g. Neosporin) twice daily and as needed to keep moist. Cover with dry dressing.  No lifting more than 5 pounds for 6 weeks from date of surgery.  No driving while taking MS Contin or oxycodone.
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
## 2013-12-24 NOTE — Progress Notes (Signed)
7 Days Post-Op  Subjective: Pt states pain is very well controlled since MScontin added yesterday and he is ready to go home. Denies N/V, +flatus, +BM last night  Objective: Vital signs in last 24 hours: Temp:  [97.4 F (36.3 C)-98.4 F (36.9 C)] 97.9 F (36.6 C) (03/20 0654) Pulse Rate:  [75-97] 83 (03/20 0654) Resp:  [16] 16 (03/20 0654) BP: (118-145)/(65-78) 118/75 mmHg (03/20 0654) SpO2:  [95 %-98 %] 96 % (03/20 0654) Last BM Date: 12/24/13  Intake/Output from previous day: 03/19 0701 - 03/20 0700 In: 360 [P.O.:360] Out: 575 [Urine:450; Drains:125] Intake/Output this shift:    JP drain: 120 ml/24hr  PE: Gen:  Alert, NAD sitting in bed Card:  RRR, no M/G/R heard Pulm:  CTA, no W/R/R Abd: Soft, NT/ND, +BS, Midline incision C/D/I with staples. JP drain with serosanguinous drainage   Lab Results:   Recent Labs  12/22/13 0500  WBC 15.6*  HGB 9.9*  HCT 28.4*  PLT 820*   BMET No results found for this basename: NA, K, CL, CO2, GLUCOSE, BUN, CREATININE, CALCIUM,  in the last 72 hours PT/INR No results found for this basename: LABPROT, INR,  in the last 72 hours CMP     Component Value Date/Time   NA 140 12/21/2013 0655   K 3.6* 12/21/2013 0655   CL 100 12/21/2013 0655   CO2 29 12/21/2013 0655   GLUCOSE 112* 12/21/2013 0655   BUN 3* 12/21/2013 0655   CREATININE 0.62 12/21/2013 0655   CALCIUM 8.4 12/21/2013 0655   PROT 5.2* 12/16/2013 2109   ALBUMIN 2.7* 12/16/2013 2109   AST 32 12/16/2013 2109   ALT 18 12/16/2013 2109   ALKPHOS 64 12/16/2013 2109   BILITOT 0.4 12/16/2013 2109   GFRNONAA >90 12/21/2013 0655   GFRAA >90 12/21/2013 0655   Lipase  No results found for this basename: lipase       Studies/Results: No results found.  Anti-infectives: Anti-infectives   Start     Dose/Rate Route Frequency Ordered Stop   12/17/13 0000  ceFAZolin (ANCEF) IVPB 2 g/50 mL premix  Status:  Discontinued     2 g 100 mL/hr over 30 Minutes Intravenous 3 times per day 12/16/13  2352 12/22/13 1118       Assessment/Plan [facility]  Splenic rupture s/p splenectomy, s/p re-exploration for hemorrhage -- Continue JP to home. Will need vaccines at discharge.  ABL anemia -stable  FEN - Added MSContin on 12/23/13 VTE - SCDs, ambulate  Dispo -- Most likely able to be d/c this afternoon.  Still awaiting amylase/lipase labs.   LOS: 8 days    Allen Rivera NeedleMichael A. Dion Saucierillery, PA-S [employer] 12/24/2013, 8:30 AM [facility] Phone #: [phone]
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
## 2013-12-24 NOTE — Progress Notes (Signed)
Patient likely to go home today, but would like to remove drain if it is not draining pancreatic fluid.  This patient has been seen and I agree with the findings and treatment plan.  Marta LamasJames O. Gae BonWyatt, III, MD, FACS [phone] (pager) [phone] (direct pager) Trauma Surgeon

## 2013-12-27 LAB — OTHER BODY FLUID CHEMISTRY

## 2013-12-29 ENCOUNTER — Encounter (INDEPENDENT_AMBULATORY_CARE_PROVIDER_SITE_OTHER): Payer: Self-pay

## 2013-12-30 ENCOUNTER — Telehealth (HOSPITAL_COMMUNITY): Payer: Self-pay

## 2013-12-30 NOTE — Telephone Encounter (Signed)
Rescheduled appt for 4/1.

## 2014-01-05 ENCOUNTER — Encounter (INDEPENDENT_AMBULATORY_CARE_PROVIDER_SITE_OTHER): Payer: Self-pay

## 2014-01-13 ENCOUNTER — Encounter (INDEPENDENT_AMBULATORY_CARE_PROVIDER_SITE_OTHER): Payer: Self-pay

## 2014-04-05 ENCOUNTER — Emergency Department (HOSPITAL_COMMUNITY)

## 2014-04-05 ENCOUNTER — Inpatient Hospital Stay (HOSPITAL_COMMUNITY)
Admission: EM | Admit: 2014-04-05 | Discharge: 2014-04-06 | DRG: 390 | Discharge status: Against Medical Advice | Attending: Surgery | Admitting: Surgery

## 2014-04-05 ENCOUNTER — Encounter (HOSPITAL_COMMUNITY): Payer: Self-pay | Admitting: Emergency Medicine

## 2014-04-05 DIAGNOSIS — Z9089 Acquired absence of other organs: Secondary | ICD-10-CM

## 2014-04-05 DIAGNOSIS — Z87891 Personal history of nicotine dependence: Secondary | ICD-10-CM | POA: Diagnosis not present

## 2014-04-05 DIAGNOSIS — K56609 Unspecified intestinal obstruction, unspecified as to partial versus complete obstruction: Secondary | ICD-10-CM | POA: Diagnosis present

## 2014-04-05 DIAGNOSIS — R112 Nausea with vomiting, unspecified: Secondary | ICD-10-CM | POA: Diagnosis present

## 2014-04-05 DIAGNOSIS — R109 Unspecified abdominal pain: Secondary | ICD-10-CM

## 2014-04-05 HISTORY — DX: Other specified health status: Z78.9

## 2014-04-05 LAB — COMPREHENSIVE METABOLIC PANEL
ALBUMIN: 4.2 g/dL (ref 3.5–5.2)
ALT: 35 U/L (ref 0–53)
AST: 34 U/L (ref 0–37)
Alkaline Phosphatase: 92 U/L (ref 39–117)
BUN: 10 mg/dL (ref 6–23)
CHLORIDE: 94 meq/L — AB (ref 96–112)
CO2: 31 mEq/L (ref 19–32)
Calcium: 9.7 mg/dL (ref 8.4–10.5)
Creatinine, Ser: 0.89 mg/dL (ref 0.50–1.35)
GFR calc Af Amer: >90 mL/min (ref >90)
GFR calc non Af Amer: >90 mL/min (ref >90)
Glucose, Bld: 89 mg/dL (ref 70–99)
Potassium: 4.2 mEq/L (ref 3.7–5.3)
SODIUM: 136 meq/L — AB (ref 137–147)
TOTAL PROTEIN: 8.3 g/dL (ref 6.0–8.3)
Total Bilirubin: 0.6 mg/dL (ref 0.3–1.2)

## 2014-04-05 LAB — LIPASE, BLOOD: LIPASE: 13 U/L (ref 11–59)

## 2014-04-05 LAB — CBC
HCT: 51.2 % (ref 39.0–52.0)
Hemoglobin: 16.4 g/dL (ref 13.0–17.0)
MCH: 25.2 pg — ABNORMAL LOW (ref 26.0–34.0)
MCHC: 32 g/dL (ref 30.0–36.0)
MCV: 78.8 fL (ref 78.0–100.0)
Platelets: 515 10*3/uL — ABNORMAL HIGH (ref 150–400)
RBC: 6.5 MIL/uL — ABNORMAL HIGH (ref 4.22–5.81)
RDW: 17.4 % — AB (ref 11.5–15.5)
WBC: 17.4 10*3/uL — ABNORMAL HIGH (ref 4.0–10.5)

## 2014-04-05 MED ORDER — ONDANSETRON HCL 4 MG/2ML IJ SOLN
4.0000 mg | Freq: Once | INTRAMUSCULAR | Status: AC
  Administered 2014-04-05: 4 mg via INTRAVENOUS
  Filled 2014-04-05: qty 2

## 2014-04-05 MED ORDER — IOHEXOL 300 MG/ML  SOLN
80.0000 mL | Freq: Once | INTRAMUSCULAR | Status: AC | PRN
  Administered 2014-04-05: 80 mL via INTRAVENOUS

## 2014-04-05 MED ORDER — SODIUM CHLORIDE 0.9 % IV BOLUS (SEPSIS)
1000.0000 mL | Freq: Once | INTRAVENOUS | Status: AC
  Administered 2014-04-05: 1000 mL via INTRAVENOUS

## 2014-04-05 MED ORDER — HYDROMORPHONE HCL PF 1 MG/ML IJ SOLN
1.0000 mg | Freq: Once | INTRAMUSCULAR | Status: AC
Start: 2014-04-05 — End: 2014-04-05
  Administered 2014-04-05: 1 mg via INTRAVENOUS
  Filled 2014-04-05: qty 1

## 2014-04-05 MED ORDER — MORPHINE SULFATE 2 MG/ML IJ SOLN
2.0000 mg | INTRAMUSCULAR | Status: DC | PRN
  Administered 2014-04-06 (×3): 2 mg via INTRAVENOUS
  Administered 2014-04-06: 4 mg via INTRAVENOUS
  Administered 2014-04-06: 2 mg via INTRAVENOUS
  Administered 2014-04-06: 4 mg via INTRAVENOUS
  Administered 2014-04-06 (×2): 2 mg via INTRAVENOUS
  Administered 2014-04-06 (×2): 4 mg via INTRAVENOUS
  Administered 2014-04-06: 2 mg via INTRAVENOUS
  Filled 2014-04-05: qty 2
  Filled 2014-04-05 (×4): qty 1
  Filled 2014-04-05 (×2): qty 2
  Filled 2014-04-05 (×3): qty 1
  Filled 2014-04-05: qty 2

## 2014-04-05 MED ORDER — IOHEXOL 300 MG/ML  SOLN
50.0000 mL | Freq: Once | INTRAMUSCULAR | Status: AC | PRN
  Administered 2014-04-05: 50 mL via ORAL

## 2014-04-05 MED ORDER — ONDANSETRON HCL 4 MG/2ML IJ SOLN: 4.0000 mg | Freq: Four times a day (QID) | INTRAMUSCULAR | Status: DC | PRN

## 2014-04-05 MED ORDER — HYDROMORPHONE HCL PF 1 MG/ML IJ SOLN
1.0000 mg | INTRAMUSCULAR | Status: AC | PRN
  Administered 2014-04-05 (×2): 1 mg via INTRAVENOUS
  Filled 2014-04-05: qty 1

## 2014-04-05 MED ORDER — LIDOCAINE HCL 2 % EX GEL
CUTANEOUS | Status: AC
  Administered 2014-04-05: 10
  Filled 2014-04-05: qty 10

## 2014-04-05 MED ORDER — KCL IN DEXTROSE-NACL 20-5-0.45 MEQ/L-%-% IV SOLN
INTRAVENOUS | Status: DC
  Administered 2014-04-05 – 2014-04-06 (×3): via INTRAVENOUS
  Filled 2014-04-05 (×5): qty 1000

## 2014-04-05 MED ORDER — MORPHINE SULFATE 4 MG/ML IJ SOLN
INTRAMUSCULAR | Status: AC
  Administered 2014-04-05: 4 mg
  Filled 2014-04-05: qty 1
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
## 2014-04-05 NOTE — ED Notes (Signed)
MD Newman at bedside 
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
## 2014-04-05 NOTE — ED Notes (Signed)
[facility] [place] [facility]. Pt has c/o abd pain with nausea. Pt had an x-ray today with results tha showed marked bowel distention mid abd possibly focal ileus. Marked constipation right colon. Pt noted not to have a BM in past 5 days. Pt sts he has had some nausea with vomiting. No urinary concerns noted.

## 2014-04-06 ENCOUNTER — Inpatient Hospital Stay (HOSPITAL_COMMUNITY)

## 2014-04-06 DIAGNOSIS — K56609 Unspecified intestinal obstruction, unspecified as to partial versus complete obstruction: Secondary | ICD-10-CM | POA: Diagnosis present

## 2014-04-06 LAB — MRSA PCR SCREENING: MRSA by PCR: NEGATIVE

## 2014-04-06 MED ORDER — HEPARIN SODIUM (PORCINE) 5000 UNIT/ML IJ SOLN
5000.0000 [IU] | Freq: Three times a day (TID) | INTRAMUSCULAR | Status: DC
  Administered 2014-04-06: 5000 [IU] via SUBCUTANEOUS
  Filled 2014-04-06 (×4): qty 1

## 2014-04-06 MED ORDER — NICOTINE 7 MG/24HR TD PT24
7.0000 mg | MEDICATED_PATCH | Freq: Every day | TRANSDERMAL | Status: DC
  Administered 2014-04-06: 7 mg via TRANSDERMAL
  Filled 2014-04-06: qty 1
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
## 2014-04-06 NOTE — Progress Notes (Signed)
Utilization review completed.  
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
## 2014-04-06 NOTE — Progress Notes (Signed)
General Surgery [facility]  Patient seen and examined.  Wants to eat.  NG still in place.  Asked officer to ambulate patient in hall TID.  He agrees.  Velora Hecklerodd M. Marielle Mantione, MD, [facility] Office: [phone]

## 2014-04-06 NOTE — Progress Notes (Signed)
Received a call from infection prevention. Due to negative PCR screen, infection prevention said it was ok to take patient off of contact precautions.

## 2014-04-06 NOTE — Progress Notes (Signed)
At 1915, nurse techs made me aware that pt was nowhere to be seen on the unit. We looked in the bathroom, and the halls, and we couldn't find the patient. We looked in the stairwell and found his IV pole and pump. The tubing had been disconnected and fluid was still running.  He had used one of the unit's mobile phones to make a few phone calls throughout the afternoon. I was present for a few of the phone calls- he called several numbers and couldn't reach anyone. Finally, he was able to reach who he said was his "grandmother" around 17:30.   I had been in patient's room around 18:30 to give him some pain medicine. Somewhere between the time I was in there and 1900 when we were going to give him report and the techs notified us he was missing, he left the unit.
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
## 2014-04-11 NOTE — Discharge Summary (Signed)
General Surgery [facility]  Agree with summary.  Will see in [facility] office.  Velora Hecklerodd M. Ulyses Panico, MD, [facility] Office: [phone]

## 2015-01-20 IMAGING — CR DG ABDOMEN 2V
2 series · 2 of 2 positions shown · non-contrast
Comparison: CT scan of the abdomen pelvis dated Saturday April, 2014

CLINICAL DATA: Diffuse abdominal pain; follow-up small bowel
obstruction

EXAM:
ABDOMEN - 2 VIEW

[w abdomen upright *]
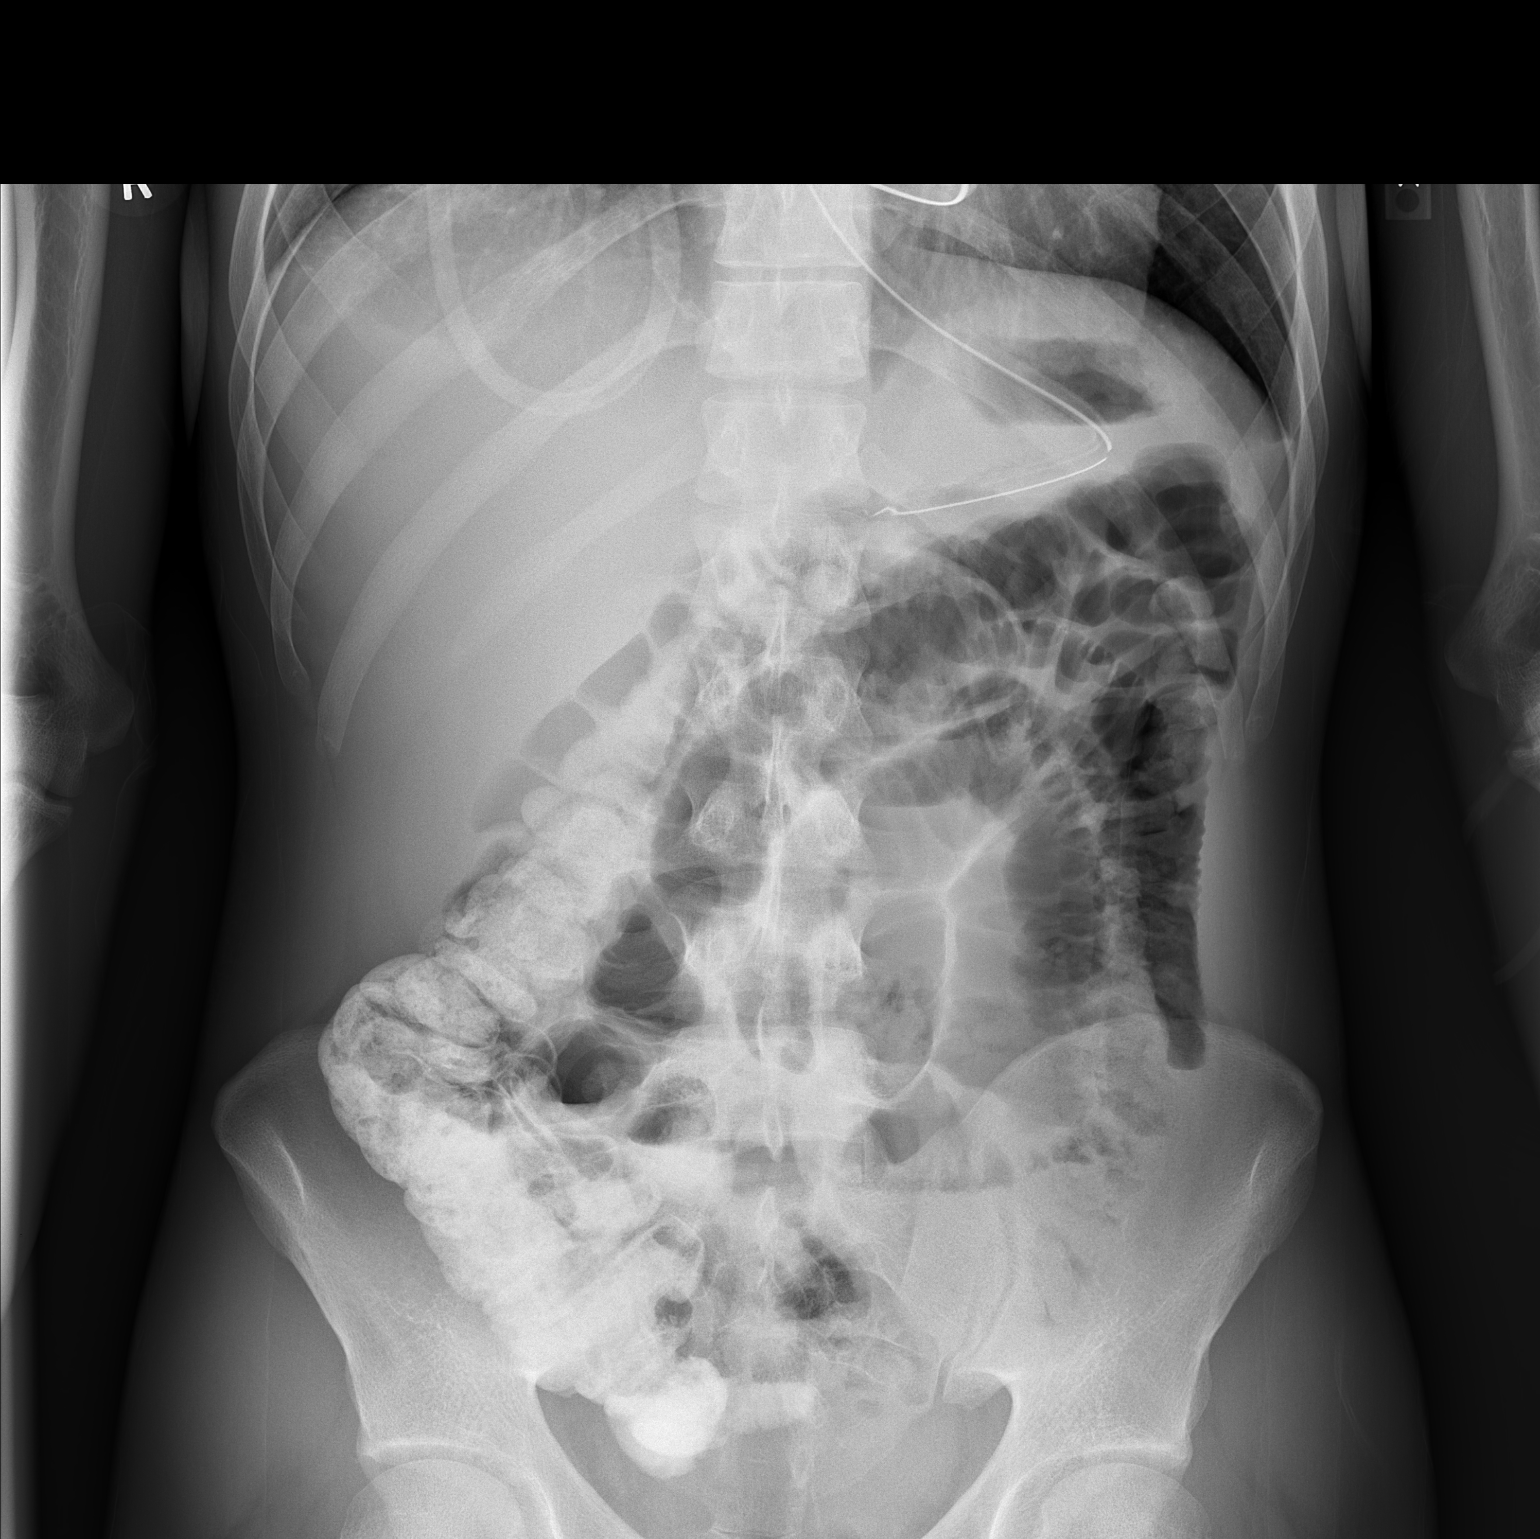

[t abdomen supine]
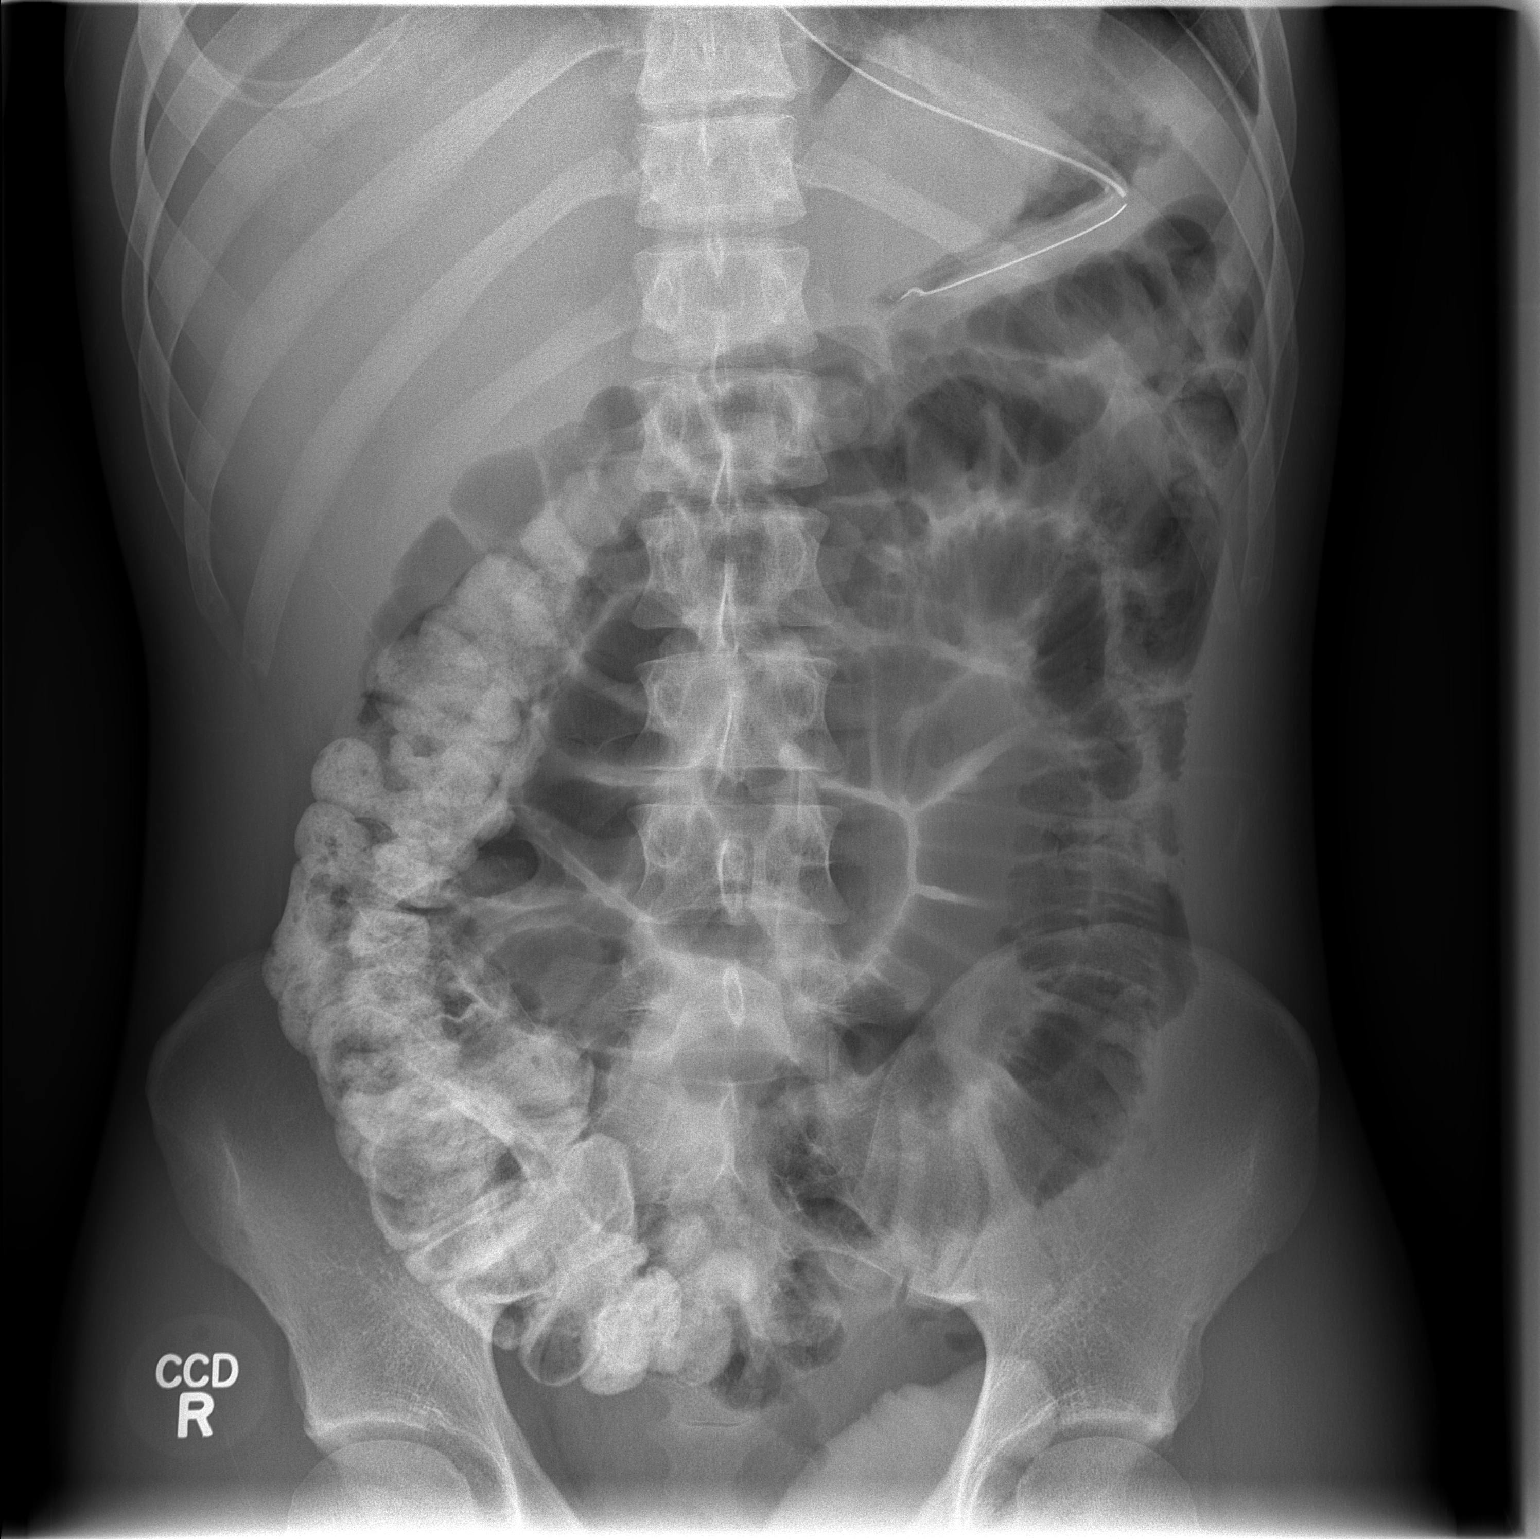

[2 of 2 positions shown; findings below may reference images not displayed]

FINDINGS: The oral contrast has migrated from the small bowel into the
ascending colon since yesterday's CT scan. There remain loops of
gas-filled distended small bowel in the mid and left abdomen. There
is no gas in the rectum. There is no free extraluminal gas. The
esophagogastric tube is in appropriate position. There is a tiny
amount of gas and fluid within the stomach. The lung bases are
clear.
IMPRESSION: The findings are consistent with persistent high-grade distal small
bowel obstruction. There has been passage of the previously
administered oral contrast from the small bowel into the right
colon.

## 2020-08-14 ENCOUNTER — Emergency Department (HOSPITAL_COMMUNITY): Payer: Self-pay

## 2020-08-14 ENCOUNTER — Emergency Department (HOSPITAL_COMMUNITY)
Admission: EM | Admit: 2020-08-14 | Discharge: 2020-08-14 | Disposition: A | Discharge status: Home / Self Care | Payer: Self-pay | Attending: Emergency Medicine | Admitting: Emergency Medicine

## 2020-08-14 DIAGNOSIS — L98491 Non-pressure chronic ulcer of skin of other sites limited to breakdown of skin: Secondary | ICD-10-CM

## 2020-08-14 DIAGNOSIS — L97311 Non-pressure chronic ulcer of right ankle limited to breakdown of skin: Secondary | ICD-10-CM | POA: Insufficient documentation

## 2020-08-14 DIAGNOSIS — L089 Local infection of the skin and subcutaneous tissue, unspecified: Secondary | ICD-10-CM

## 2020-08-14 DIAGNOSIS — Z87891 Personal history of nicotine dependence: Secondary | ICD-10-CM | POA: Insufficient documentation

## 2020-08-14 LAB — COMPREHENSIVE METABOLIC PANEL
ALT: 37 U/L (ref 0–44)
AST: 43 U/L — ABNORMAL HIGH (ref 15–41)
Albumin: 4.1 g/dL (ref 3.5–5.0)
Alkaline Phosphatase: 74 U/L (ref 38–126)
Anion gap: 9 (ref 5–15)
BUN: 14 mg/dL (ref 6–20)
CO2: 25 mmol/L (ref 22–32)
Calcium: 9.3 mg/dL (ref 8.9–10.3)
Chloride: 103 mmol/L (ref 98–111)
Creatinine, Ser: 0.92 mg/dL (ref 0.61–1.24)
GFR, Estimated: >60 mL/min (ref >60)
Glucose, Bld: 101 mg/dL — ABNORMAL HIGH (ref 70–99)
Potassium: 3.9 mmol/L (ref 3.5–5.1)
Sodium: 137 mmol/L (ref 135–145)
Total Bilirubin: 0.7 mg/dL (ref 0.3–1.2)
Total Protein: 7.7 g/dL (ref 6.5–8.1)

## 2020-08-14 LAB — CBC
HCT: 53.5 % — ABNORMAL HIGH (ref 39.0–52.0)
Hemoglobin: 17.8 g/dL — ABNORMAL HIGH (ref 13.0–17.0)
MCH: 30.7 pg (ref 26.0–34.0)
MCHC: 33.3 g/dL (ref 30.0–36.0)
MCV: 92.4 fL (ref 80.0–100.0)
Platelets: 396 10*3/uL (ref 150–400)
RBC: 5.79 MIL/uL (ref 4.22–5.81)
RDW: 14.5 % (ref 11.5–15.5)
WBC: 11.7 10*3/uL — ABNORMAL HIGH (ref 4.0–10.5)
nRBC: 0 % (ref 0.0–0.2)

## 2020-08-14 LAB — LACTIC ACID, PLASMA: Lactic Acid, Venous: 1.3 mmol/L (ref 0.5–1.9)

## 2020-08-14 MED ORDER — DOXYCYCLINE HYCLATE 100 MG PO CAPS: 100.0000 mg | ORAL_CAPSULE | Freq: Two times a day (BID) | ORAL | 0 refills | Status: AC
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
## 2020-08-14 NOTE — Discharge Instructions (Addendum)
Please use the printed coupon for either the [employer] or [employer]. I have printed both of these from [employer] for you. Please take antibiotic as prescribed. Please follow-up with primary care doctor. If you do not have a primary care doctor please follow-up, [facility]. I provided you with their phone number and address.  Stop using injection drugs. This can and very easily may cause a life-ending disease, cause blindness, seizures, paralysis or death.  If you use lemons or other acidic compound to inject please stop as this can inject mold spores into your blood.

## 2020-08-14 NOTE — ED Notes (Signed)
Attempted to obtain 1 set of blood cultures. Vein rolled and unable to obtain culture. Pulled needle out when pt asked me to. Pt now hostile and argumentative. Pt wants another nurse to obtain cultures. Asked another RN to obtain cultures. Explained to pt that I would go over DC paperwork as the other RN obtains cultures. Pt noticeably very upset and raising voice towards staff. Pt stated, " You have me up for discharge and you aren't even going to treat me! I don't want to do the blood if yall are just going to discharge me and not treat me!" The other RN and I both tried to explain to pt that the culture results don't come back today and they will grow out bacteria in 2-3 days and be called with the results to find out if he indeed has MRSA which he came here to find out. Also explained to pt that cultures are need to appropriately treat his infection based on what the cultures grow and that he is being prescribed doxycycline. Pt refused both sets of cultures.

## 2020-08-14 NOTE — ED Notes (Signed)
Pt reports 3 wounds to R ankle that have been ongoing for approximately 1 week. He has an open wound approximately the size of a dime to his R medial ankle, a closed and scabbed wound to his posterior ankle and another small scabbed area on the anterior aspect of his ankle. Pt is concerned for MRSA. Denies any recent IV drug use. Reports last IV drug use was 2 weeks ago when he injected heroin into his arms. He states, "They pop up everywhere even if I don't shoot up there", referring to his wounds. Denies injecting drugs in ankle.

## 2020-08-14 NOTE — ED Triage Notes (Signed)
Pt here with c/o wound to his right ankle , pt wants to know if its MRSA

## 2020-08-14 NOTE — ED Notes (Signed)
Discharge paperwork reviewed w/ pt by Doreatha Martin, RN. Patient A&Ox4, VSS, and ambulatory with steady gait upon discharge.
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
## 2021-05-30 IMAGING — CR DG ANKLE COMPLETE 3+V*R*
3 series · 3 of 3 positions shown · non-contrast
Comparison: None.

CLINICAL DATA: Ankle wound

EXAM:
RIGHT ANKLE - COMPLETE 3+ VIEW

[ankle ap]
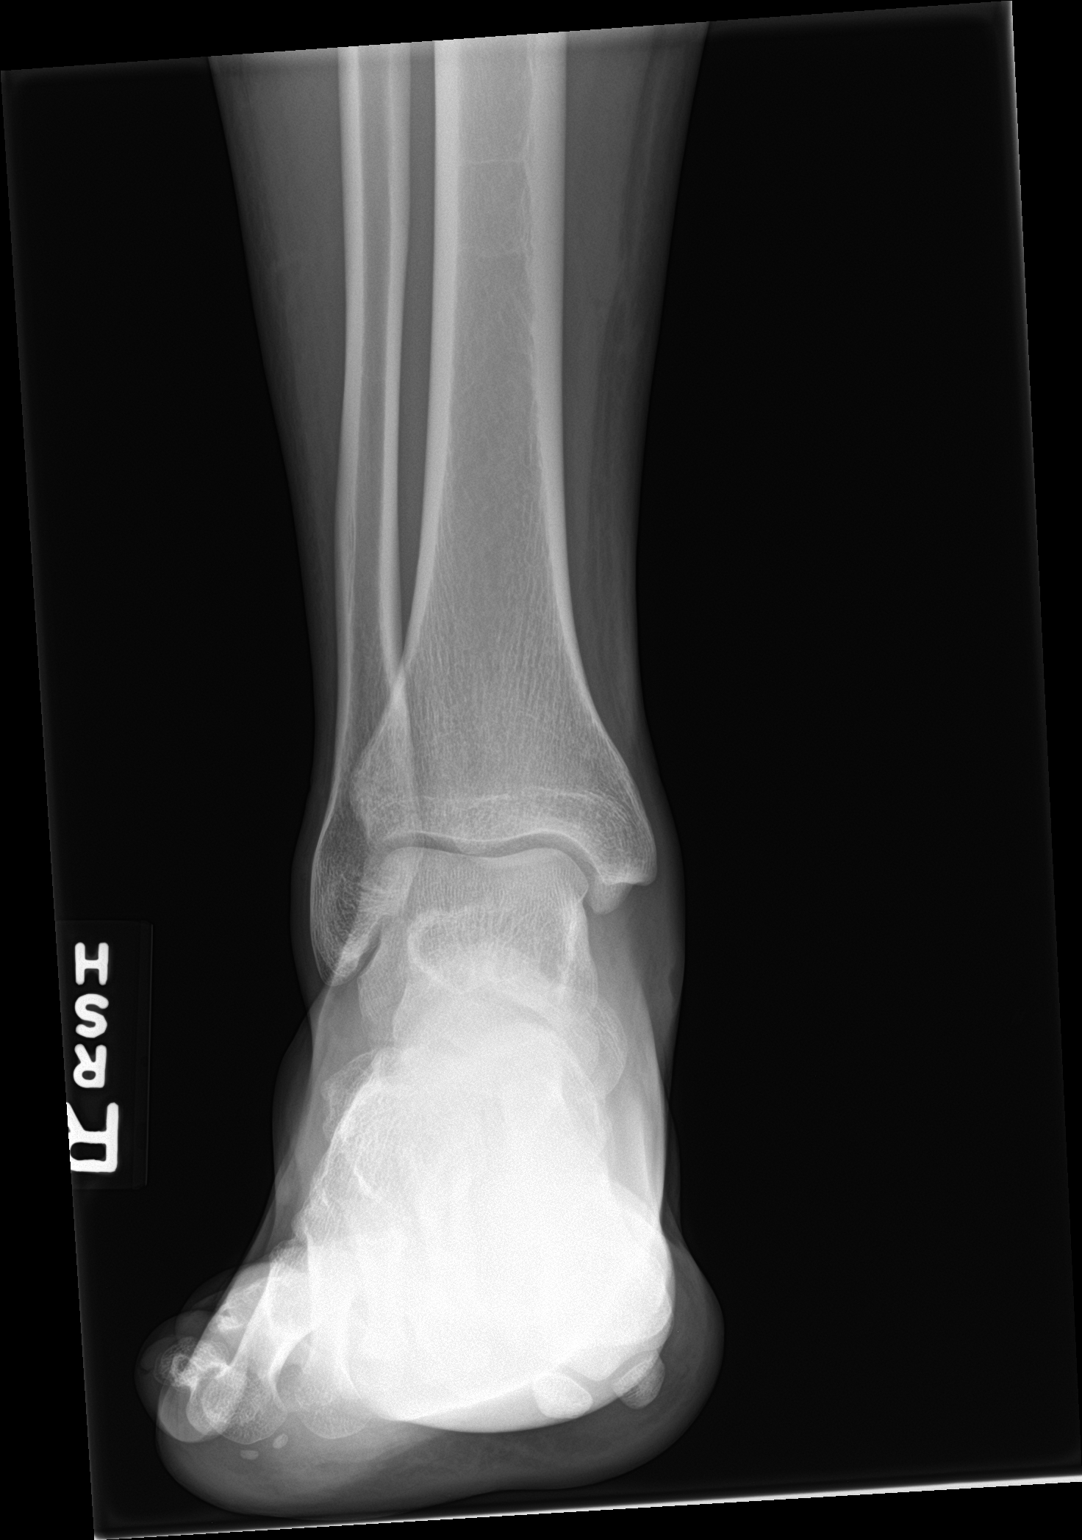

[ankle obl]
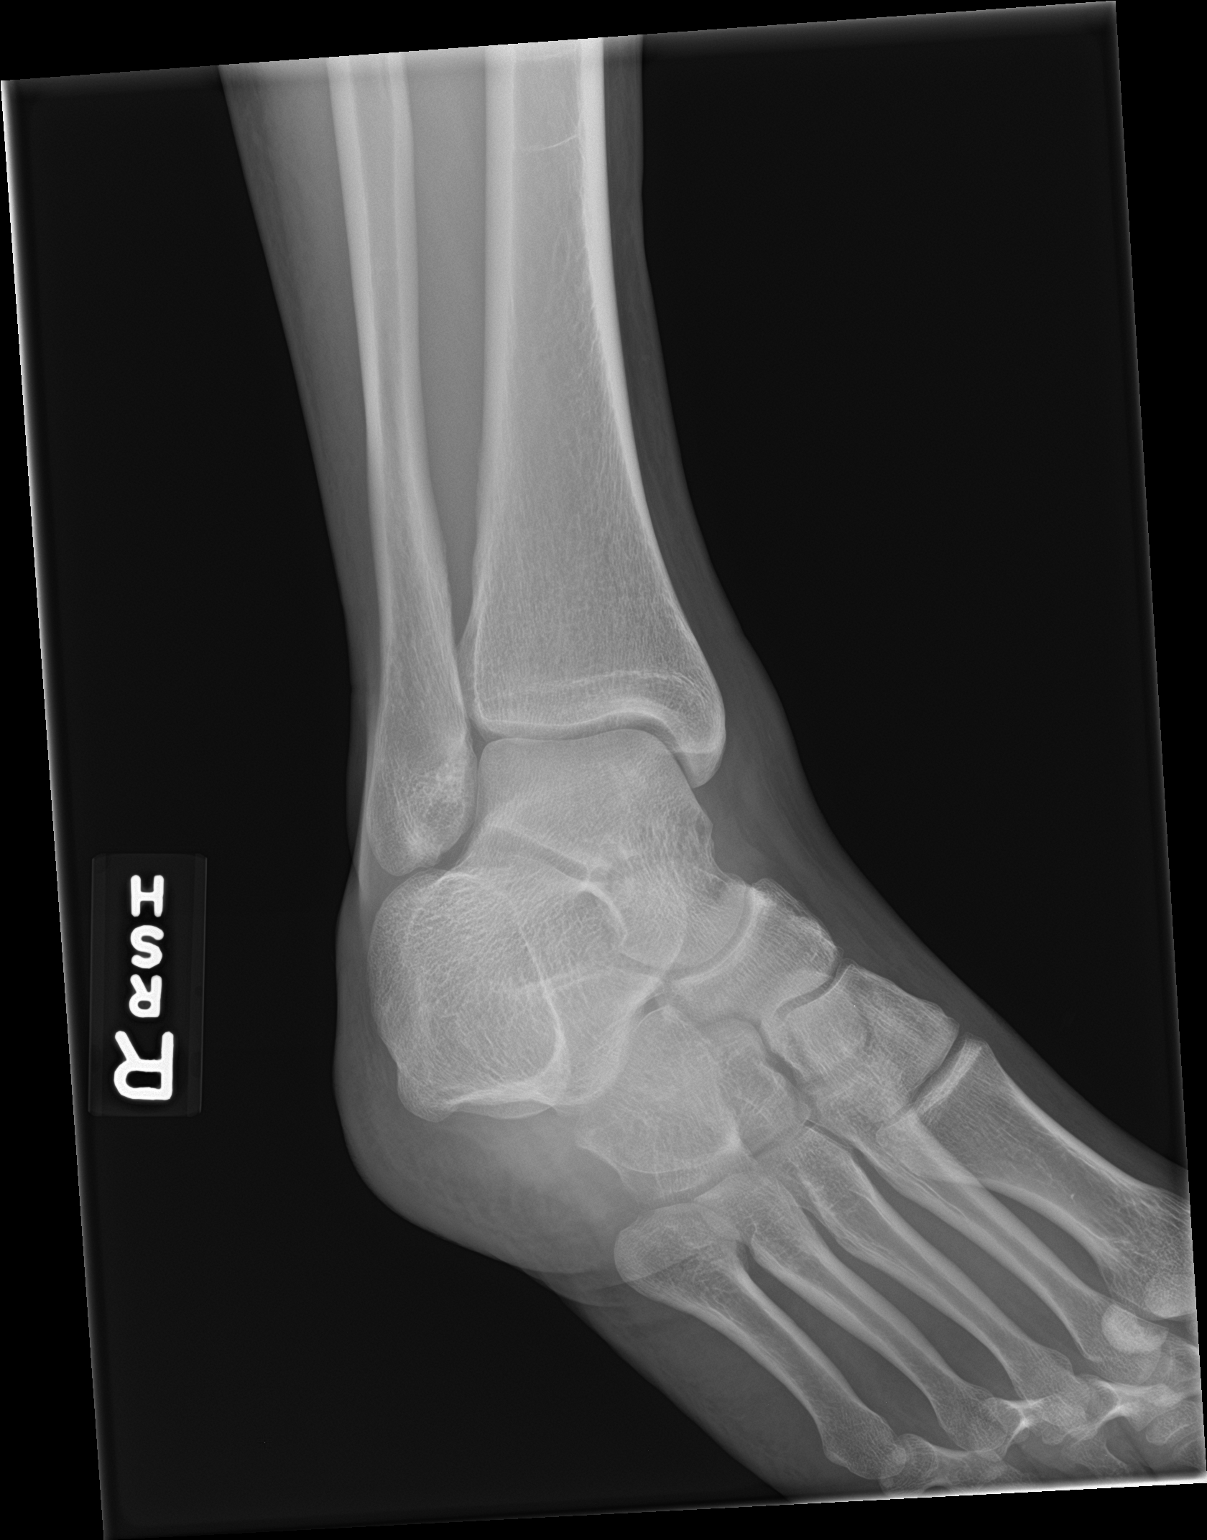

[ankle lat]
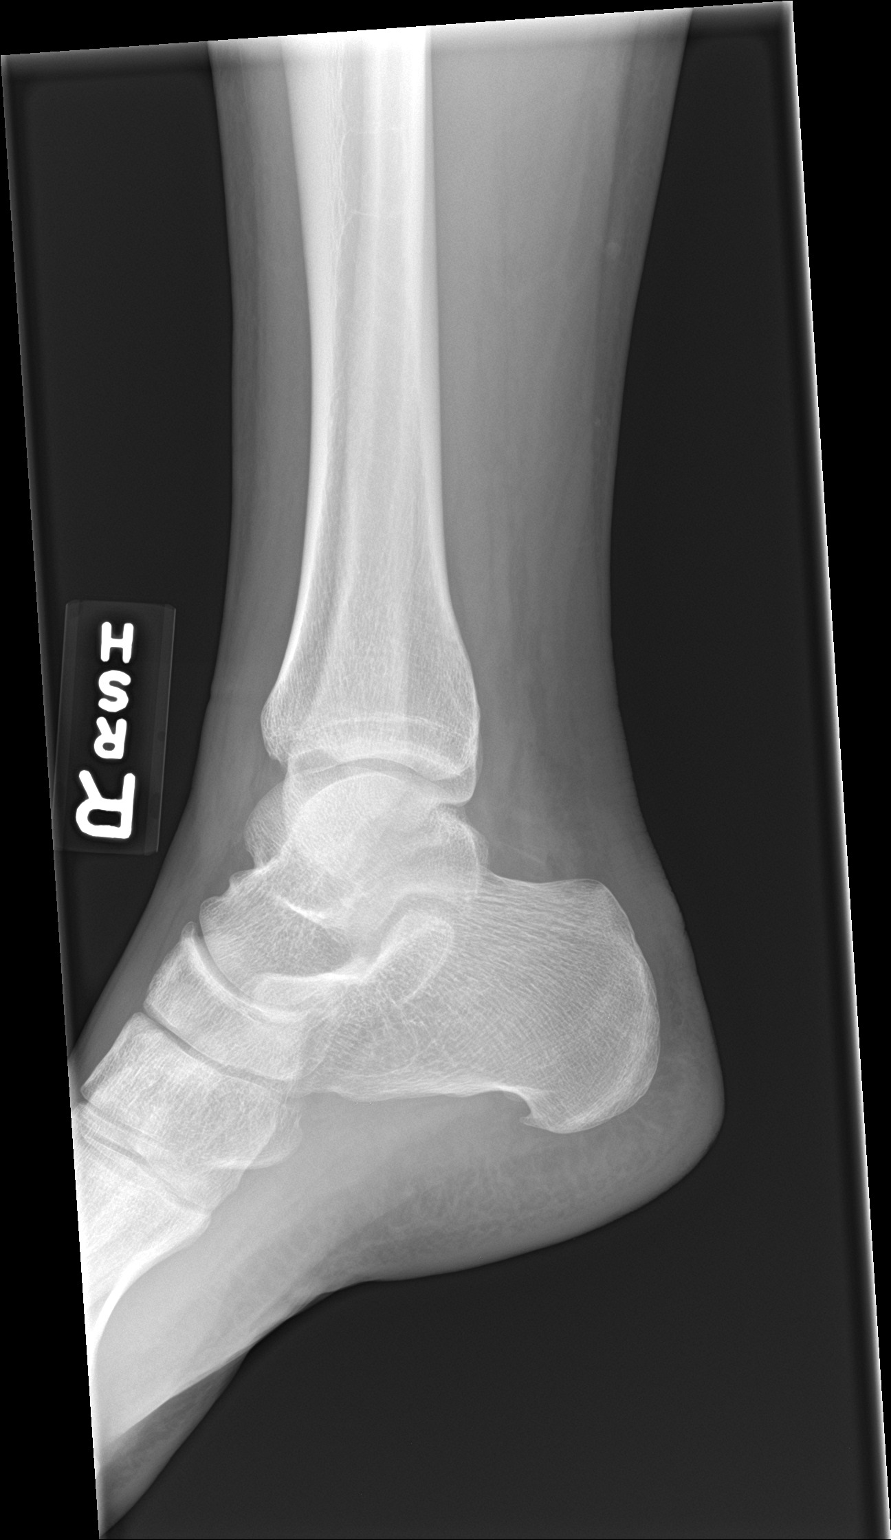

[3 of 3 positions shown; findings below may reference images not displayed]

FINDINGS: Mild soft tissue swelling is noted medially with apparent soft
tissue ulcer. No underlying bony abnormality is seen. No foreign
body is noted.
IMPRESSION: Soft tissue changes consistent with the given clinical history of
ulceration. No foreign body or acute bony abnormality is noted.
